# Patient Record
Sex: Female | Born: 1990 | Race: Black or African American | Hispanic: No | Marital: Single | Smoking: Former smoker
Health system: Southern US, Community
[De-identification: ages and names within clinical notes are randomized; demographics above are authoritative.]

## PROBLEM LIST (undated history)

## (undated) DIAGNOSIS — J45909 Unspecified asthma, uncomplicated: Secondary | ICD-10-CM

## (undated) DIAGNOSIS — I10 Essential (primary) hypertension: Secondary | ICD-10-CM

## (undated) DIAGNOSIS — K529 Noninfective gastroenteritis and colitis, unspecified: Secondary | ICD-10-CM

## (undated) HISTORY — DX: Unspecified asthma, uncomplicated: J45.909

## (undated) HISTORY — DX: Noninfective gastroenteritis and colitis, unspecified: K52.9

## (undated) HISTORY — DX: Essential (primary) hypertension: I10

---

## 2009-11-13 ENCOUNTER — Emergency Department (HOSPITAL_COMMUNITY): Admission: EM | Admit: 2009-11-13 | Discharge: 2009-11-13 | Payer: Self-pay | Admitting: Family Medicine

## 2009-11-18 ENCOUNTER — Emergency Department (HOSPITAL_COMMUNITY): Admission: EM | Admit: 2009-11-18 | Discharge: 2009-11-18 | Payer: Self-pay | Admitting: Emergency Medicine

## 2010-04-25 LAB — HEMOCCULT GUIAC POC 1CARD (OFFICE): Fecal Occult Bld: POSITIVE

## 2016-01-07 ENCOUNTER — Ambulatory Visit (INDEPENDENT_AMBULATORY_CARE_PROVIDER_SITE_OTHER): Payer: 59 | Admitting: Physician Assistant

## 2016-01-07 VITALS — BP 158/98 | HR 96 | Temp 98.7°F | Resp 17 | Ht 60.0 in | Wt 128.0 lb

## 2016-01-07 DIAGNOSIS — K529 Noninfective gastroenteritis and colitis, unspecified: Secondary | ICD-10-CM | POA: Diagnosis not present

## 2016-01-07 DIAGNOSIS — I10 Essential (primary) hypertension: Secondary | ICD-10-CM | POA: Diagnosis not present

## 2016-01-07 DIAGNOSIS — R109 Unspecified abdominal pain: Secondary | ICD-10-CM | POA: Diagnosis not present

## 2016-01-07 DIAGNOSIS — Z79899 Other long term (current) drug therapy: Secondary | ICD-10-CM

## 2016-01-07 MED ORDER — PROPRANOLOL HCL ER 60 MG PO CP24: 60.0000 mg | ORAL_CAPSULE | Freq: Every day | ORAL | 1 refills | Status: DC

## 2016-01-07 MED ORDER — MESALAMINE 1000 MG RE SUPP: 1000.0000 mg | Freq: Every day | RECTAL | 0 refills | Status: DC

## 2016-01-07 NOTE — Patient Instructions (Addendum)
Please schedule an appointment with GI for further work-up of your abdominal pain. See below for home care tips.  Return in one month for blood pressure recheck.   Thank you for coming in today. I hope you feel we met your needs.  Feel free to call [facility] if you have any questions or further requests.  Please consider signing up for MyChart if you do not already have it, as this is a great way to communicate with me.  Best,  Whitney McVey, PA-C    Ulcerative Colitis, Adult Ulcerative colitis is long-lasting (chronic) swelling (inflammation) of the large intestine (colon). Sores (ulcers) may also form on the colon. Ulcerative colitis is closely related to another condition of inflammation of the intestines that is called Crohn disease. Together, they are frequently referred to as inflammatory bowel disease (IBD). What are the causes? Ulcerative colitis is caused by increased activity of the immune system in the intestines. The immune system is the system that protects the body against harmful bacteria, viruses, fungi, and other things that can make you sick. When the immune system overacts, it causes inflammation. The cause of the increased immune system activity is not known. What increases the risk? Risk factors of ulcerative colitis include:  Age. This includes:  Being 50-7 years old.  Being older than 27 years old.  Having a family history of ulcerative colitis.  Being of Jewish descent. What are the signs or symptoms? Common symptoms of ulcerative colitis include rectal bleeding and diarrhea. There is a wide range of symptoms, and a person's symptoms depend on how severe the condition is. Additional symptoms may include:  Pain or cramping in the belly (abdomen).  Fever.  Fatigue.  Weight loss.  Night sweats.  Rectal pain.  Feeling the immediate need to have a bowel movement.  Nausea.  Loss of appetite.  Anemia.  Joint pain or soreness.  Eye  irritation.  Certain skin rashes. How is this diagnosed? Ulcerative colitis may be diagnosed by:  Medical history and physical exam.  Blood tests and stool tests.  X-rays.  CT scans.  Colonoscopy. For this test, a flexible tube is inserted into your anus and your colon is examined.  Examination of a tissue sample from your colon (biopsy). How is this treated? Treatment for ulcerative colitis may include medicines to:  Decrease inflammation.  Control your immune system. Surgery may also be necessary. Follow these instructions at home: Medicines and vitamins  Take medicines only as directed by your doctor. Do not take aspirin.  Ask your doctor if you should take any vitamins or supplements. Lifestyle  Exercise regularly.  Limit alcohol intake to no more than 1 drink per day for nonpregnant women and 2 drinks per day for men. One drink equals 12 ounces of beer, 5 ounces of wine, or 1 ounces of hard liquor. Eating and drinking  Drink enough fluid to keep your urine clear or pale yellow.  Ask your health care provider about the best diet for you. Follow the diet as directed by your health care provider. This may include:  Avoiding carbonated drinks.  Avoiding popcorn, vegetable skins, nuts, and other high-fiber foods when you have symptoms of ulcerative colitis.  Eating smaller meals more often.  Keeping a food diary. This may help you to find and avoid any foods that make you feel not well.  Limit your caffeine intake. General instructions  Keep all follow-up appointments as directed by your health care provider. This is important. Contact a  health care provider if:  Your symptoms do not improve or get worse with treatment.  You continue to lose weight.  You have constant cramps or loose bowels.  You develop a new skin rash, skin sores, or eye problems.  You have a fever or chills. Get help right away if:  You have bloody diarrhea.  You have severe  pain in your abdomen.  You vomit. This information is not intended to replace advice given to you by your health care provider. Make sure you discuss any questions you have with your health care provider. Document Released: 11/06/2004 Document Revised: 09/30/2015 Document Reviewed: 05/22/2014 Elsevier Interactive Patient Education  2017 [employer].  IF you received an x-ray today, you will receive an invoice from [facility]. Please contact [facility] at [phone] with questions or concerns regarding your invoice.   IF you received labwork today, you will receive an invoice from [employer]. Please contact [facility] at [phone] with questions or concerns regarding your invoice.   Our billing staff will not be able to assist you with questions regarding bills from these companies.  You will be contacted with the lab results as soon as they are available. The fastest way to get your results is to activate your My Chart account. Instructions are located on the last page of this paperwork. If you have not heard from [place] regarding the results in 2 weeks, please contact this office.

## 2016-01-07 NOTE — Progress Notes (Signed)
Mickle MalloryChaquell Cocuzza  MRN: [id number] DOB: 09/16/1990  PCP: No primary care provider on file.  Subjective:  Pt is a pleasant 25 year old female who presents to clinic for multiple complaints. She is from [place], [place], has been living in [place] for three years.  She works at [employer] and [employer] [place].   Colitis - She was diagnosed with colitis while living in [place]. She cannot recall what type of colitis. She was supposed to get colonoscopy but did not have insurance at the time. Last treatment was 3-4 years ago - she cannot recall what medications she was prescribed.  Left-sided abdominal pain flared up two weeks ago. Comes and goes in waves, feels like pain of colitis flare. The pain woke her up this morning. Describes her painas aching and throbbing, 5/10 pain. Notes occasional blood in stool, twice a month. Occasional constipation. Denies diarrhea, nausea, fevers, chills, vomiting.   HTN - Blood pressure today is 158/98. She was treated for HTN in high school with Propranolol. Reports controlled blood pressure and no side effects. She has not been on medication for 6 months to a year due to cost of prescription and not having insurance. She now has different insurance and would like a refill of Propranolol. Denies headache, light-headedness, vision changes, chest pain, palpitations, SOB.    Review of Systems  Constitutional: Negative for chills, diaphoresis, fatigue and fever.  Respiratory: Negative for cough, chest tightness, shortness of breath and wheezing.   Cardiovascular: Negative for chest pain and palpitations.  Gastrointestinal: Positive for abdominal pain and blood in stool. Negative for abdominal distention, diarrhea, nausea and vomiting.  Neurological: Negative for weakness, light-headedness and headaches.    There are no active problems to display for this patient.   No current outpatient prescriptions on file prior to visit.   No  current facility-administered medications on file prior to visit.     No Known Allergies   Objective:  BP (!) 158/98 (BP Location: Right Arm, Patient Position: Sitting, Cuff Size: Normal)   Pulse 96   Temp 98.7 F (37.1 C) (Oral)   Resp 17   Ht 5' (1.524 m)   Wt 128 lb (58.1 kg)   LMP 12/07/2015   SpO2 98%   BMI 25.00 kg/m   Physical Exam  Constitutional: She is oriented to person, place, and time and well-developed, well-nourished, and in no distress. No distress.  Cardiovascular: Normal rate, regular rhythm and normal heart sounds.   Abdominal: Soft. Normal appearance and bowel sounds are normal. She exhibits no distension. There is tenderness in the left lower quadrant. There is no rigidity and no guarding.  Neurological: She is alert and oriented to person, place, and time. GCS score is 15.  Skin: Skin is warm and dry.  Psychiatric: Mood, memory, affect and judgment normal.  Vitals reviewed.   Assessment and Plan :  1. Essential hypertension 2. Encounter for medication management - propranolol ER (INDERAL LA) 60 MG 24 hr capsule; Take 1 capsule (60 mg total) by mouth daily.  Dispense: 30 capsule; Refill: 1 - RTC in one month for blood pressure recheck.   3. Abdominal pain, unspecified abdominal location 4. Colitis - mesalamine (CANASA) 1000 MG suppository; Place 1 suppository (1,000 mg total) rectally at bedtime.  Dispense: 30 suppository; Refill: 0 - Ambulatory referral to Gastroenterology - Will treat. Referral to GI for further work-up and imaging as she is unsure about the details of her colitis. Will request medical records  from her PCP in [place].    Marco CollieWhitney Biance Moncrief, PA-C  [facility] 01/07/2016 3:07 PM

## 2016-01-09 ENCOUNTER — Encounter: Payer: Self-pay | Admitting: Physician Assistant

## 2016-01-16 ENCOUNTER — Ambulatory Visit (INDEPENDENT_AMBULATORY_CARE_PROVIDER_SITE_OTHER): Payer: 59 | Admitting: Physician Assistant

## 2016-01-16 ENCOUNTER — Other Ambulatory Visit (INDEPENDENT_AMBULATORY_CARE_PROVIDER_SITE_OTHER): Payer: 59

## 2016-01-16 ENCOUNTER — Encounter: Payer: Self-pay | Admitting: Physician Assistant

## 2016-01-16 VITALS — BP 140/88 | HR 60 | Ht 60.0 in | Wt 128.4 lb

## 2016-01-16 DIAGNOSIS — K625 Hemorrhage of anus and rectum: Secondary | ICD-10-CM

## 2016-01-16 DIAGNOSIS — R109 Unspecified abdominal pain: Secondary | ICD-10-CM

## 2016-01-16 LAB — CBC WITH DIFFERENTIAL/PLATELET
BASOS ABS: 0 10*3/uL (ref 0.0–0.1)
Basophils Relative: 0.7 % (ref 0.0–3.0)
EOS ABS: 0.1 10*3/uL (ref 0.0–0.7)
Eosinophils Relative: 1.4 % (ref 0.0–5.0)
HEMATOCRIT: 42 % (ref 36.0–46.0)
Hemoglobin: 14.1 g/dL (ref 12.0–15.0)
LYMPHS PCT: 40.2 % (ref 12.0–46.0)
Lymphs Abs: 1.8 10*3/uL (ref 0.7–4.0)
MCHC: 33.5 g/dL (ref 30.0–36.0)
MCV: 85.3 fl (ref 78.0–100.0)
Monocytes Absolute: 0.4 10*3/uL (ref 0.1–1.0)
Monocytes Relative: 9.3 % (ref 3.0–12.0)
NEUTROS ABS: 2.1 10*3/uL (ref 1.4–7.7)
Neutrophils Relative %: 48.4 % (ref 43.0–77.0)
PLATELETS: 252 10*3/uL (ref 150.0–400.0)
RBC: 4.92 Mil/uL (ref 3.87–5.11)
RDW: 13.4 % (ref 11.5–15.5)
WBC: 4.4 10*3/uL (ref 4.0–10.5)

## 2016-01-16 LAB — COMPREHENSIVE METABOLIC PANEL
ALT: 8 U/L (ref 0–35)
AST: 13 U/L (ref 0–37)
Albumin: 4.1 g/dL (ref 3.5–5.2)
Alkaline Phosphatase: 63 U/L (ref 39–117)
BILIRUBIN TOTAL: 0.4 mg/dL (ref 0.2–1.2)
BUN: 10 mg/dL (ref 6–23)
CALCIUM: 9.2 mg/dL (ref 8.4–10.5)
CO2: 30 meq/L (ref 19–32)
CREATININE: 0.85 mg/dL (ref 0.40–1.20)
Chloride: 103 mEq/L (ref 96–112)
GFR: 102.66 mL/min (ref >60.00)
GLUCOSE: 93 mg/dL (ref 70–99)
Potassium: 4.6 mEq/L (ref 3.5–5.1)
SODIUM: 137 meq/L (ref 135–145)
Total Protein: 7.4 g/dL (ref 6.0–8.3)

## 2016-01-16 LAB — SEDIMENTATION RATE: Sed Rate: 32 mm/hr — ABNORMAL HIGH (ref 0–20)

## 2016-01-16 LAB — HIGH SENSITIVITY CRP: CRP HIGH SENSITIVITY: 3.35 mg/L (ref 0.000–5.000)

## 2016-01-16 MED ORDER — NA SULFATE-K SULFATE-MG SULF 17.5-3.13-1.6 GM/177ML PO SOLN: 1.0000 | Freq: Once | ORAL | 0 refills | Status: AC

## 2016-01-16 NOTE — Progress Notes (Addendum)
Subjective:    Patient ID: Cheryl Hansen, female    DOB: 07-31-1990, 25 y.o.   MRN: [id number]  HPI Cheryl Hansen is a pleasant 25 year old African-American female, new to GI today referred by [facility]/Elizabeth McVeigh PA-C for evaluation of left-sided abdominal pain and intermittent rectal bleeding. Patient states that she did have some GI evaluation done about 5 years ago in [place] was told that she probably had ulcerative colitis. She states that she did not have health insurance at that time and so could not undergo colonoscopy. We did obtain copies of those records. She was seen in 2013 by [facility] after an ER visit with complaints of abdominal pain. She apparently had CT of the abdomen and pelvis done showing a probable mild colitis. She was treated with short course of Cipro and Flagyl, and then colonoscopy was recommended which she did not follow through with. She says over the past few years had some mild left-sided abdominal pain off and on but nothing significant and also has noted small amounts of bright red blood mixed in with her bowel movements vary sporadically over the past couple of years. She says she may see blood once or twice a month. 2 weeks ago she had an episode of more significant left abdominal pain and cramping that lasted for several days and has since resolved. She has no complaints of diarrhea and says her bowel movements are fairly regular. She had and given a Canasa suppository by urgent care. She did not have any labs or imaging done. Family history is pertinent for grandmother diagnosed with colon cancer in her 46s is no family history of IBD. Other medical problems include hypertension and asthma.  Review of Systems Pertinent positive and negative review of systems were noted in the above HPI section.  All other review of systems was otherwise negative.  Outpatient Encounter Prescriptions as of 01/16/2016    Medication Sig  . mesalamine (CANASA) 1000 MG suppository Place 1 suppository (1,000 mg total) rectally at bedtime.  . propranolol ER (INDERAL LA) 60 MG 24 hr capsule Take 1 capsule (60 mg total) by mouth daily.  . Na Sulfate-K Sulfate-Mg Sulf 17.5-3.13-1.6 GM/180ML SOLN Take 1 kit by mouth once.   No facility-administered encounter medications on file as of 01/16/2016.    No Known Allergies There are no active problems to display for this patient.  Social History   Social History  . Marital status: Single    Spouse name: N/A  . Number of children: N/A  . Years of education: N/A   Occupational History  . Not on file.   Social History Main Topics  . Smoking status: Former Research scientist (life sciences)  . Smokeless tobacco: Never Used  . Alcohol use No  . Drug use: No  . Sexual activity: Not on file   Other Topics Concern  . Not on file   Social History Narrative  . No narrative on file    Ms. Tirrell's family history includes Cancer in her paternal grandmother; Hyperlipidemia in her mother; Hypertension in her maternal grandmother and paternal grandfather.      Objective:    Vitals:   01/16/16 0837  BP: 140/88  Pulse: 60    Physical Exam  well-developed young female in no acute distress, pleasant blood pressure 140/88 pulse 60, Height 5 foot, weight 128, BMI 25.0. HEENT; nontraumatic normocephalic EOMI PERRLA sclera anicteric, Cardiovascular; regular rate and rhythm with S1-S2 no murmur or gallop, Pulmonary ;clear  bilaterally, Abdomen; soft , minimally tender left mid left lower quadrant there is no guarding or rebound no palpable mass or hepatosplenomegaly, Rectal; exam not done, Extremities; no clubbing cyanosis or edema skin warm and dry, Neuropsych ;mood and affect appropriate       Assessment & Plan:   #74 25 year old African-American female with a four-year history of intermittent left-sided abdominal pain and sporadic episodes of bright red blood mixed with bowel movements.  Patient had an episode of more intense left-sided abdominal pain a few weeks ago which has since subsided. Rule out IBD, rule out IBS with intermittent hemorrhoidal bleeding #2 hypertension #3 asthma  #4 positive family history of colon cancer in patient's grandmother  Plan; check CBC with differential, CMET, sedimentation rate, and CRP Schedule for colonoscopy with Dr. Ardis Hughs. Procedure discussed in detail with the patient including risks and benefits and she is agreeable to proceed. Further plans and recommendations pending results of above.   Addendum records received from colonoscopy done in June 2015 Dr. Anne Hahn [place]  [facility] was a normal exam with the exception of first grade internal hemorrhoids  Kyair Ditommaso S Timiko Offutt PA-C 01/16/2016   Cc: Desiree Lucy*

## 2016-01-16 NOTE — Patient Instructions (Signed)
Please go to the basement level to have your labs drawn.   You have been scheduled for a colonoscopy. Please follow written instructions given to you at your visit today.  Please pick up your prep supplies at the pharmacy within the next 1-3 days. If you use inhalers (even only as needed), please bring them with you on the day of your procedure. Your physician has requested that you go to www.startemmi.com and enter the access code given to you at your visit today. This web site gives a general overview about your procedure. However, you should still follow specific instructions given to you by our office regarding your preparation for the procedure. 

## 2016-01-17 NOTE — Progress Notes (Signed)
I agree with the above note, plan 

## 2016-01-21 ENCOUNTER — Encounter: Payer: 59 | Admitting: Gastroenterology

## 2016-03-03 ENCOUNTER — Other Ambulatory Visit: Payer: Self-pay | Admitting: *Deleted

## 2016-03-03 ENCOUNTER — Telehealth: Payer: Self-pay | Admitting: Physician Assistant

## 2016-03-03 NOTE — Telephone Encounter (Signed)
LM for the patient to advise her new instructions for the rescheduled colonoscopy date of 03-10-2016 are at the front desk for her.

## 2016-03-10 ENCOUNTER — Ambulatory Visit (AMBULATORY_SURGERY_CENTER): Payer: 59 | Admitting: Gastroenterology

## 2016-03-10 ENCOUNTER — Encounter: Payer: Self-pay | Admitting: Gastroenterology

## 2016-03-10 VITALS — BP 127/81 | HR 64 | Temp 97.3°F | Resp 15 | Ht 64.0 in | Wt 128.0 lb

## 2016-03-10 DIAGNOSIS — R1032 Left lower quadrant pain: Secondary | ICD-10-CM | POA: Diagnosis not present

## 2016-03-10 DIAGNOSIS — K625 Hemorrhage of anus and rectum: Secondary | ICD-10-CM

## 2016-03-10 DIAGNOSIS — K649 Unspecified hemorrhoids: Secondary | ICD-10-CM

## 2016-03-10 MED ORDER — SODIUM CHLORIDE 0.9 % IV SOLN: 500.0000 mL | INTRAVENOUS | Status: DC

## 2016-03-10 NOTE — Progress Notes (Signed)
Report given to PACU RN, vss 

## 2016-03-10 NOTE — Patient Instructions (Signed)
Discharge instructions given. Handout on hemorrhoids. Resume previous medications. YOU HAD AN ENDOSCOPIC PROCEDURE TODAY AT THE [facility]:   Refer to the procedure report that was given to you for any specific questions about what was found during the examination.  If the procedure report does not answer your questions, please call your gastroenterologist to clarify.  If you requested that your care partner not be given the details of your procedure findings, then the procedure report has been included in a sealed envelope for you to review at your convenience later.  YOU SHOULD EXPECT: Some feelings of bloating in the abdomen. Passage of more gas than usual.  Walking can help get rid of the air that was put into your GI tract during the procedure and reduce the bloating. If you had a lower endoscopy (such as a colonoscopy or flexible sigmoidoscopy) you may notice spotting of blood in your stool or on the toilet paper. If you underwent a bowel prep for your procedure, you may not have a normal bowel movement for a few days.  Please Note:  You might notice some irritation and congestion in your nose or some drainage.  This is from the oxygen used during your procedure.  There is no need for concern and it should clear up in a day or so.  SYMPTOMS TO REPORT IMMEDIATELY:   Following lower endoscopy (colonoscopy or flexible sigmoidoscopy):  Excessive amounts of blood in the stool  Significant tenderness or worsening of abdominal pains  Swelling of the abdomen that is new, acute  Fever of 100F or higher   For urgent or emergent issues, a gastroenterologist can be reached at any hour by calling [phone].   DIET:  We do recommend a small meal at first, but then you may proceed to your regular diet.  Drink plenty of fluids but you should avoid alcoholic beverages for 24 hours.  ACTIVITY:  You should plan to take it easy for the rest of today and you should NOT DRIVE or use heavy  machinery until tomorrow (because of the sedation medicines used during the test).    FOLLOW UP: Our staff will call the number listed on your records the next business day following your procedure to check on you and address any questions or concerns that you may have regarding the information given to you following your procedure. If we do not reach you, we will leave a message.  However, if you are feeling well and you are not experiencing any problems, there is no need to return our call.  We will assume that you have returned to your regular daily activities without incident.  If any biopsies were taken you will be contacted by phone or by letter within the next 1-3 weeks.  Please call us at [phone] if you have not heard about the biopsies in 3 weeks.    SIGNATURES/CONFIDENTIALITY: You and/or your care partner have signed paperwork which will be entered into your electronic medical record.  These signatures attest to the fact that that the information above on your After Visit Summary has been reviewed and is understood.  Full responsibility of the confidentiality of this discharge information lies with you and/or your care-partner. 

## 2016-03-10 NOTE — Op Note (Signed)
[facility] Patient Name: Cheryl Hansen Procedure Date: 03/10/2016 3:18 PM MRN: [id number] Endoscopist: Rachael Feeaniel P Darianne Muralles , MD Age: 26 Referring MD:  Date of Birth: 02/09/1991 Gender: Female Account #: [id number] Procedure:                Colonoscopy Indications:              Abdominal pain in the left lower quadrant,                            Hematochezia Medicines:                Monitored Anesthesia Care Procedure:                Pre-Anesthesia Assessment:                           - Prior to the procedure, a History and Physical                            was performed, and patient medications and                            allergies were reviewed. The patient's tolerance of                            previous anesthesia was also reviewed. The risks                            and benefits of the procedure and the sedation                            options and risks were discussed with the patient.                            All questions were answered, and informed consent                            was obtained. Prior Anticoagulants: The patient has                            taken no previous anticoagulant or antiplatelet                            agents. ASA Grade Assessment: II - A patient with                            mild systemic disease. After reviewing the risks                            and benefits, the patient was deemed in                            satisfactory condition to undergo the procedure.  After obtaining informed consent, the colonoscope                            was passed under direct vision. Throughout the                            procedure, the patient's blood pressure, pulse, and                            oxygen saturations were monitored continuously. The                            Model CF-HQ190L [id number]) scope was introduced                            through the anus and advanced to the the terminal                             ileum. The colonoscopy was performed without                            difficulty. The patient tolerated the procedure                            well. The quality of the bowel preparation was                            excellent. The terminal ileum, ileocecal valve,                            appendiceal orifice, and rectum were photographed. Scope In: 3:20:49 PM Scope Out: 3:29:38 PM Scope Withdrawal Time: 0 hours 7 minutes 20 seconds  Total Procedure Duration: 0 hours 8 minutes 49 seconds  Findings:                 The terminal ileum appeared normal.                           External hemorrhoids were found. The hemorrhoids                            were small.                           The exam was otherwise without abnormality on                            direct and retroflexion views. Complications:            No immediate complications. Estimated blood loss:                            None                           . Estimated Blood Loss:     Estimated  blood loss: none. Impression:               - The examined portion of the ileum was normal.                           - External hemorrhoids.                           - The examination was otherwise normal on direct                            and retroflexion views.                           - No polyps, cancer, inflammation or infection. Recommendation:           - Patient has a contact number available for                            emergencies. The signs and symptoms of potential                            delayed complications were discussed with the                            patient. Return to normal activities tomorrow.                            Written discharge instructions were provided to the                            patient.                           - Resume previous diet.                           - Continue present medications.                           - Repeat colonoscopy at age  89 for screening                            purposes. Rachael Fee, MD 03/10/2016 3:32:41 PM This report has been signed electronically.

## 2016-03-11 ENCOUNTER — Telehealth: Payer: Self-pay

## 2016-03-11 NOTE — Telephone Encounter (Signed)
  Follow up Call-  Call back number 03/10/2016  Post procedure Call Back phone  # [phone]  Permission to leave phone message Yes  Some recent data might be hidden     Patient questions:  Do you have a fever, pain , or abdominal swelling? No. Pain Score  0 *  Have you tolerated food without any problems? Yes.    Have you been able to return to your normal activities? Yes.    Do you have any questions about your discharge instructions: Diet   No. Medications  No. Follow up visit  No.  Do you have questions or concerns about your Care? No.  Actions: * If pain score is 4 or above: No action needed, pain <4.

## 2016-07-02 ENCOUNTER — Encounter: Payer: Self-pay | Admitting: Physician Assistant

## 2016-07-02 ENCOUNTER — Ambulatory Visit (INDEPENDENT_AMBULATORY_CARE_PROVIDER_SITE_OTHER): Payer: 59 | Admitting: Physician Assistant

## 2016-07-02 VITALS — BP 163/100 | HR 86 | Temp 98.6°F | Resp 16 | Ht 64.0 in | Wt 134.6 lb

## 2016-07-02 DIAGNOSIS — I10 Essential (primary) hypertension: Secondary | ICD-10-CM | POA: Diagnosis not present

## 2016-07-02 DIAGNOSIS — R030 Elevated blood-pressure reading, without diagnosis of hypertension: Secondary | ICD-10-CM

## 2016-07-02 DIAGNOSIS — Z79899 Other long term (current) drug therapy: Secondary | ICD-10-CM | POA: Diagnosis not present

## 2016-07-02 MED ORDER — PROPRANOLOL HCL ER 60 MG PO CP24: 60.0000 mg | ORAL_CAPSULE | Freq: Every day | ORAL | 6 refills | Status: DC

## 2016-07-02 NOTE — Patient Instructions (Addendum)
Please schedule an annual exam for your next appointment. We will do your PAP. We can refill your medications at that time as well.   Thank you for coming in today. I hope you feel we met your needs.  Feel free to call [facility] if you have any questions or further requests.  Please consider signing up for MyChart if you do not already have it, as this is a great way to communicate with me.  Best,  Whitney McVey, PA-C  IF you received an x-ray today, you will receive an invoice from [facility]. Please contact [facility] at [phone] with questions or concerns regarding your invoice.   IF you received labwork today, you will receive an invoice from [facility]. Please contact [facility] at [phone] with questions or concerns regarding your invoice.   Our billing staff will not be able to assist you with questions regarding bills from these companies.  You will be contacted with the lab results as soon as they are available. The fastest way to get your results is to activate your My Chart account. Instructions are located on the last page of this paperwork. If you have not heard from [place] regarding the results in 2 weeks, please contact this office.    \

## 2016-07-02 NOTE — Progress Notes (Signed)
   Cheryl MalloryChaquell Hansen  MRN: [id number] DOB: 12/19/1990  PCP: Sebastian AcheMcVey, Declan Adamson Whitney, PA-C  Subjective:  Pt is a 26 year old female who presents to clinic for blood pressure check and medication refill. She is from [place], [place], [place] been living in [place].  She works at [employer] and [employer] [place]. She was treated for HTN in high school with Propranolol. She takes Propranolol 60mg  qd. She is out and has not had her medication today.  Today's blood pressure is 163/100.  She thinks she needs a higher dose.  Denies chest pain, headache, shob, palpitations, vision changes.   There are care gaps for this patient including PAP and Tdap.   Review of Systems  Respiratory: Negative for chest tightness and shortness of breath.   Cardiovascular: Negative for chest pain and palpitations.  Neurological: Negative for syncope, light-headedness and headaches.    There are no active problems to display for this patient.   Current Outpatient Prescriptions on File Prior to Visit  Medication Sig Dispense Refill  . propranolol ER (INDERAL LA) 60 MG 24 hr capsule Take 1 capsule (60 mg total) by mouth daily. 30 capsule 1   Current Facility-Administered Medications on File Prior to Visit  Medication Dose Route Frequency Provider Last Rate Last Dose  . 0.9 %  sodium chloride infusion  500 mL Intravenous Continuous Rachael FeeJacobs, Daniel P, MD        No Known Allergies   Objective:  BP (!) 163/100   Pulse 86   Temp 98.6 F (37 C) (Oral)   Resp 16   Ht 5\' 4"  (1.626 m)   Wt 134 lb 9.6 oz (61.1 kg)   LMP 05/30/2016   SpO2 100%   BMI 23.10 kg/m   Physical Exam  Constitutional: She is oriented to person, place, and time and well-developed, well-nourished, and in no distress. No distress.  Cardiovascular: Normal rate, regular rhythm and normal heart sounds.   Neurological: She is alert and oriented to person, place, and time. GCS score is 15.  Skin: Skin is  warm and dry.  Psychiatric: Mood, memory, affect and judgment normal.  Vitals reviewed.   Assessment and Plan :  1. Encounter for medication management 2. Essential hypertension 3. Elevated blood pressure reading  - propranolol ER (INDERAL LA) 60 MG 24 hr capsule; Take 1 capsule (60 mg total) by mouth daily.  Dispense: 60 capsule; Refill: 6 - Pt is out of her medication x 1 day. Asymptomatic.  Advised pt to RTC sooner for refills. She is due for her annual exam, overdue for her PAP and Tdap. She understands and agrees.    Marco CollieWhitney Shailene Demonbreun, PA-C  [facility] 07/02/2016 8:09 AM

## 2016-11-26 ENCOUNTER — Encounter: Payer: Self-pay | Admitting: Physician Assistant

## 2016-11-26 ENCOUNTER — Ambulatory Visit (INDEPENDENT_AMBULATORY_CARE_PROVIDER_SITE_OTHER): Payer: 59 | Admitting: Physician Assistant

## 2016-11-26 VITALS — BP 124/90 | HR 58 | Temp 98.0°F | Resp 16 | Ht 60.5 in | Wt 144.2 lb

## 2016-11-26 DIAGNOSIS — R635 Abnormal weight gain: Secondary | ICD-10-CM

## 2016-11-26 DIAGNOSIS — Z202 Contact with and (suspected) exposure to infections with a predominantly sexual mode of transmission: Secondary | ICD-10-CM

## 2016-11-26 DIAGNOSIS — Z1329 Encounter for screening for other suspected endocrine disorder: Secondary | ICD-10-CM

## 2016-11-26 DIAGNOSIS — Z130 Encounter for screening for diseases of the blood and blood-forming organs and certain disorders involving the immune mechanism: Secondary | ICD-10-CM

## 2016-11-26 DIAGNOSIS — Z01419 Encounter for gynecological examination (general) (routine) without abnormal findings: Secondary | ICD-10-CM | POA: Diagnosis not present

## 2016-11-26 DIAGNOSIS — Z23 Encounter for immunization: Secondary | ICD-10-CM

## 2016-11-26 DIAGNOSIS — Z124 Encounter for screening for malignant neoplasm of cervix: Secondary | ICD-10-CM

## 2016-11-26 DIAGNOSIS — N926 Irregular menstruation, unspecified: Secondary | ICD-10-CM | POA: Diagnosis not present

## 2016-11-26 DIAGNOSIS — Z13220 Encounter for screening for lipoid disorders: Secondary | ICD-10-CM | POA: Diagnosis not present

## 2016-11-26 DIAGNOSIS — Z0000 Encounter for general adult medical examination without abnormal findings: Secondary | ICD-10-CM | POA: Diagnosis not present

## 2016-11-26 LAB — POCT URINALYSIS DIP (MANUAL ENTRY)
Bilirubin, UA: NEGATIVE
Blood, UA: NEGATIVE
Glucose, UA: NEGATIVE mg/dL
Ketones, POC UA: NEGATIVE mg/dL
Leukocytes, UA: NEGATIVE
Nitrite, UA: NEGATIVE
Protein Ur, POC: NEGATIVE mg/dL
Spec Grav, UA: <=1.005 — AB (ref 1.010–1.025)
Urobilinogen, UA: 0.2 U/dL
pH, UA: 6 (ref 5.0–8.0)

## 2016-11-26 LAB — POCT URINE PREGNANCY: Preg Test, Ur: NEGATIVE

## 2016-11-26 NOTE — Patient Instructions (Addendum)
- Improving your diet will help bring down your blood pressure (see below on DASH diet).  - Try to track down your old medical records to see if you have ever had imaging done testing for "renal artery stenosis". Sometimes this condition can cause high blood pressure in young people.  - Ask your siblings if they have hypertension - and how long have they had it. - You will receive a phone call to schedule an appt with GYN - Call your insurance and ask which dentists are covered... And make an appointment! Start brushing twice daily.  - We will contact you about today's lab work when the results come back.   Thank you for coming in today. I hope you feel we met your needs.  Feel free to call PCP if you have any questions or further requests.  Please consider signing up for MyChart if you do not already have it, as this is a great way to communicate with me.  Best,  Whitney McVey, PA-C  DASH Eating Plan DASH stands for "Dietary Approaches to Stop Hypertension." The DASH eating plan is a healthy eating plan that has been shown to reduce high blood pressure (hypertension). It may also reduce your risk for type 2 diabetes, heart disease, and stroke. The DASH eating plan may also help with weight loss. What are tips for following this plan? General guidelines  Avoid eating more than 2,300 mg (milligrams) of salt (sodium) a day. If you have hypertension, you may need to reduce your sodium intake to 1,500 mg a day.  Limit alcohol intake to no more than 1 drink a day for nonpregnant women and 2 drinks a day for men. One drink equals 12 oz of beer, 5 oz of wine, or 1 oz of hard liquor.  Work with your health care provider to maintain a healthy body weight or to lose weight. Ask what an ideal weight is for you.  Get at least 30 minutes of exercise that causes your heart to beat faster (aerobic exercise) most days of the week. Activities may include walking, swimming, or biking.  Work with your  health care provider or diet and nutrition specialist (dietitian) to adjust your eating plan to your individual calorie needs. Reading food labels  Check food labels for the amount of sodium per serving. Choose foods with less than 5 percent of the Daily Value of sodium. Generally, foods with less than 300 mg of sodium per serving fit into this eating plan.  To find whole grains, look for the word "whole" as the first word in the ingredient list. Shopping  Buy products labeled as "low-sodium" or "no salt added."  Buy fresh foods. Avoid canned foods and premade or frozen meals. Cooking  Avoid adding salt when cooking. Use salt-free seasonings or herbs instead of table salt or sea salt. Check with your health care provider or pharmacist before using salt substitutes.  Do not fry foods. Cook foods using healthy methods such as baking, boiling, grilling, and broiling instead.  Cook with heart-healthy oils, such as olive, canola, soybean, or sunflower oil. Meal planning   Eat a balanced diet that includes: ? 5 or more servings of fruits and vegetables each day. At each meal, try to fill half of your plate with fruits and vegetables. ? Up to 6-8 servings of whole grains each day. ? Less than 6 oz of lean meat, poultry, or fish each day. A 3-oz serving of meat is about the same size as  a deck of cards. One egg equals 1 oz. ? 2 servings of low-fat dairy each day. ? A serving of nuts, seeds, or beans 5 times each week. ? Heart-healthy fats. Healthy fats called Omega-3 fatty acids are found in foods such as flaxseeds and coldwater fish, like sardines, salmon, and mackerel.  Limit how much you eat of the following: ? Canned or prepackaged foods. ? Food that is high in trans fat, such as fried foods. ? Food that is high in saturated fat, such as fatty meat. ? Sweets, desserts, sugary drinks, and other foods with added sugar. ? Full-fat dairy products.  Do not salt foods before eating.  Try  to eat at least 2 vegetarian meals each week.  Eat more home-cooked food and less restaurant, buffet, and fast food.  When eating at a restaurant, ask that your food be prepared with less salt or no salt, if possible. What foods are recommended? The items listed may not be a complete list. Talk with your dietitian about what dietary choices are best for you. Grains Whole-grain or whole-wheat bread. Whole-grain or whole-wheat pasta. Brown rice. [place]. Bulgur. Whole-grain and low-sodium cereals. Pita bread. Low-fat, low-sodium crackers. Whole-wheat flour tortillas. Vegetables Fresh or frozen vegetables (raw, steamed, roasted, or grilled). Low-sodium or reduced-sodium tomato and vegetable juice. Low-sodium or reduced-sodium tomato sauce and tomato paste. Low-sodium or reduced-sodium canned vegetables. Fruits All fresh, dried, or frozen fruit. Canned fruit in natural juice (without added sugar). Meat and other protein foods Skinless chicken or [place]. Ground chicken or [place]. Pork with fat trimmed off. Fish and seafood. Egg whites. Dried beans, peas, or lentils. Unsalted nuts, nut butters, and seeds. Unsalted canned beans. Lean cuts of beef with fat trimmed off. Low-sodium, lean deli meat. Dairy Low-fat (1%) or fat-free (skim) milk. Fat-free, low-fat, or reduced-fat cheeses. Nonfat, low-sodium ricotta or cottage cheese. Low-fat or nonfat yogurt. Low-fat, low-sodium cheese. Fats and oils Soft margarine without trans fats. Vegetable oil. Low-fat, reduced-fat, or light mayonnaise and salad dressings (reduced-sodium). Canola, safflower, olive, soybean, and sunflower oils. Avocado. Seasoning and other foods Herbs. Spices. Seasoning mixes without salt. Unsalted popcorn and pretzels. Fat-free sweets. What foods are not recommended? The items listed may not be a complete list. Talk with your dietitian about what dietary choices are best for you. Grains Baked goods made with fat, such as  croissants, muffins, or some breads. Dry pasta or rice meal packs. Vegetables Creamed or fried vegetables. Vegetables in a cheese sauce. Regular canned vegetables (not low-sodium or reduced-sodium). Regular canned tomato sauce and paste (not low-sodium or reduced-sodium). Regular tomato and vegetable juice (not low-sodium or reduced-sodium). Angie Fava. Olives. Fruits Canned fruit in a light or heavy syrup. Fried fruit. Fruit in cream or butter sauce. Meat and other protein foods Fatty cuts of meat. Ribs. Fried meat. Berniece Salines. Sausage. Bologna and other processed lunch meats. Salami. Fatback. Hotdogs. Bratwurst. Salted nuts and seeds. Canned beans with added salt. Canned or smoked fish. Whole eggs or egg yolks. Chicken or [place] with skin. Dairy Whole or 2% milk, cream, and half-and-half. Whole or full-fat cream cheese. Whole-fat or sweetened yogurt. Full-fat cheese. Nondairy creamers. Whipped toppings. Processed cheese and cheese spreads. Fats and oils Butter. Stick margarine. Lard. Shortening. Ghee. Bacon fat. Tropical oils, such as coconut, palm kernel, or palm oil. Seasoning and other foods Salted popcorn and pretzels. Onion salt, garlic salt, seasoned salt, table salt, and sea salt. Worcestershire sauce. Tartar sauce. Barbecue sauce. Teriyaki sauce. Soy sauce, including reduced-sodium. Steak sauce.  Canned and packaged gravies. Fish sauce. Oyster sauce. Cocktail sauce. Horseradish that you find on the shelf. Ketchup. Mustard. Meat flavorings and tenderizers. Bouillon cubes. Hot sauce and Tabasco sauce. Premade or packaged marinades. Premade or packaged taco seasonings. Relishes. Regular salad dressings. Where to find more information:  National Heart, Lung, and [place]: https://wilson-eaton.com/  American Heart Association: www.heart.org Summary  The DASH eating plan is a healthy eating plan that has been shown to reduce high blood pressure (hypertension). It may also reduce your risk for type 2  diabetes, heart disease, and stroke.  With the DASH eating plan, you should limit salt (sodium) intake to 2,300 mg a day. If you have hypertension, you may need to reduce your sodium intake to 1,500 mg a day.  When on the DASH eating plan, aim to eat more fresh fruits and vegetables, whole grains, lean proteins, low-fat dairy, and heart-healthy fats.  Work with your health care provider or diet and nutrition specialist (dietitian) to adjust your eating plan to your individual calorie needs. This information is not intended to replace advice given to you by your health care provider. Make sure you discuss any questions you have with your health care provider. Document Released: 01/16/2011 Document Revised: 01/21/2016 Document Reviewed: 01/21/2016 Elsevier Interactive Patient Education  2017 [place] Maintenance, Female Adopting a healthy lifestyle and getting preventive care can go a long way to promote health and wellness. Talk with your health care provider about what schedule of regular examinations is right for you. This is a good chance for you to check in with your provider about disease prevention and staying healthy. In between checkups, there are plenty of things you can do on your own. Experts have done a lot of research about which lifestyle changes and preventive measures are most likely to keep you healthy. Ask your health care provider for more information. Weight and diet Eat a healthy diet  Be sure to include plenty of vegetables, fruits, low-fat dairy products, and lean protein.  Do not eat a lot of foods high in solid fats, added sugars, or salt.  Get regular exercise. This is one of the most important things you can do for your health. ? Most adults should exercise for at least 150 minutes each week. The exercise should increase your heart rate and make you sweat (moderate-intensity exercise). ? Most adults should also do strengthening exercises at least twice a  week. This is in addition to the moderate-intensity exercise.  Maintain a healthy weight  Body mass index (BMI) is a measurement that can be used to identify possible weight problems. It estimates body fat based on height and weight. Your health care provider can help determine your BMI and help you achieve or maintain a healthy weight.  For females 60 years of age and older: ? A BMI below 18.5 is considered underweight. ? A BMI of 18.5 to 24.9 is normal. ? A BMI of 25 to 29.9 is considered overweight. ? A BMI of 30 and above is considered obese.  Watch levels of cholesterol and blood lipids  You should start having your blood tested for lipids and cholesterol at 28 years of age, then have this test every 5 years.  You may need to have your cholesterol levels checked more often if: ? Your lipid or cholesterol levels are high. ? You are older than 28 years of age. ? You are at high risk for heart disease.  Cancer screening Lung Cancer  Lung  cancer screening is recommended for adults 75-77 years old who are at high risk for lung cancer because of a history of smoking.  A yearly low-dose CT scan of the lungs is recommended for people who: ? Currently smoke. ? Have quit within the past 15 years. ? Have at least a 30-pack-year history of smoking. A pack year is smoking an average of one pack of cigarettes a day for 1 year.  Yearly screening should continue until it has been 15 years since you quit.  Yearly screening should stop if you develop a health problem that would prevent you from having lung cancer treatment.  Breast Cancer  Practice breast self-awareness. This means understanding how your breasts normally appear and feel.  It also means doing regular breast self-exams. Let your health care provider know about any changes, no matter how small.  If you are in your 20s or 30s, you should have a clinical breast exam (CBE) by a health care provider every 1-3 years as part of a  regular health exam.  If you are 65 or older, have a CBE every year. Also consider having a breast X-ray (mammogram) every year.  If you have a family history of breast cancer, talk to your health care provider about genetic screening.  If you are at high risk for breast cancer, talk to your health care provider about having an MRI and a mammogram every year.  Breast cancer gene (BRCA) assessment is recommended for women who have family members with BRCA-related cancers. BRCA-related cancers include: ? Breast. ? Ovarian. ? Tubal. ? Peritoneal cancers.  Results of the assessment will determine the need for genetic counseling and BRCA1 and BRCA2 testing.  Cervical Cancer Your health care provider may recommend that you be screened regularly for cancer of the pelvic organs (ovaries, uterus, and vagina). This screening involves a pelvic examination, including checking for microscopic changes to the surface of your cervix (Pap test). You may be encouraged to have this screening done every 3 years, beginning at age 83.  For women ages 36-65, health care providers may recommend pelvic exams and Pap testing every 3 years, or they may recommend the Pap and pelvic exam, combined with testing for human papilloma virus (HPV), every 5 years. Some types of HPV increase your risk of cervical cancer. Testing for HPV may also be done on women of any age with unclear Pap test results.  Other health care providers may not recommend any screening for nonpregnant women who are considered low risk for pelvic cancer and who do not have symptoms. Ask your health care provider if a screening pelvic exam is right for you.  If you have had past treatment for cervical cancer or a condition that could lead to cancer, you need Pap tests and screening for cancer for at least 20 years after your treatment. If Pap tests have been discontinued, your risk factors (such as having a new sexual partner) need to be reassessed to  determine if screening should resume. Some women have medical problems that increase the chance of getting cervical cancer. In these cases, your health care provider may recommend more frequent screening and Pap tests.  Colorectal Cancer  This type of cancer can be detected and often prevented.  Routine colorectal cancer screening usually begins at 28 years of age and continues through 28 years of age.  Your health care provider may recommend screening at an earlier age if you have risk factors for colon cancer.  Your health  care provider may also recommend using home test kits to check for hidden blood in the stool.  A small camera at the end of a tube can be used to examine your colon directly (sigmoidoscopy or colonoscopy). This is done to check for the earliest forms of colorectal cancer.  Routine screening usually begins at age 35.  Direct examination of the colon should be repeated every 5-10 years through 28 years of age. However, you may need to be screened more often if early forms of precancerous polyps or small growths are found.  Skin Cancer  Check your skin from head to toe regularly.  Tell your health care provider about any new moles or changes in moles, especially if there is a change in a mole's shape or color.  Also tell your health care provider if you have a mole that is larger than the size of a pencil eraser.  Always use sunscreen. Apply sunscreen liberally and repeatedly throughout the day.  Protect yourself by wearing long sleeves, pants, a wide-brimmed hat, and sunglasses whenever you are outside.  Heart disease, diabetes, and high blood pressure  High blood pressure causes heart disease and increases the risk of stroke. High blood pressure is more likely to develop in: ? People who have blood pressure in the high end of the normal range (130-139/85-89 mm Hg). ? People who are overweight or obese. ? People who are African American.  If you are 18-39 years  of age, have your blood pressure checked every 3-5 years. If you are 82 years of age or older, have your blood pressure checked every year. You should have your blood pressure measured twice-once when you are at a hospital or clinic, and once when you are not at a hospital or clinic. Record the average of the two measurements. To check your blood pressure when you are not at a hospital or clinic, you can use: ? An automated blood pressure machine at a pharmacy. ? A home blood pressure monitor.  If you are between 73 years and 57 years old, ask your health care provider if you should take aspirin to prevent strokes.  Have regular diabetes screenings. This involves taking a blood sample to check your fasting blood sugar level. ? If you are at a normal weight and have a low risk for diabetes, have this test once every three years after 28 years of age. ? If you are overweight and have a high risk for diabetes, consider being tested at a younger age or more often. Preventing infection Hepatitis B  If you have a higher risk for hepatitis B, you should be screened for this virus. You are considered at high risk for hepatitis B if: ? You were born in a country where hepatitis B is common. Ask your health care provider which countries are considered high risk. ? Your parents were born in a high-risk country, and you have not been immunized against hepatitis B (hepatitis B vaccine). ? You have HIV or AIDS. ? You use needles to inject street drugs. ? You live with someone who has hepatitis B. ? You have had sex with someone who has hepatitis B. ? You get hemodialysis treatment. ? You take certain medicines for conditions, including cancer, organ transplantation, and autoimmune conditions.  Hepatitis C  Blood testing is recommended for: ? Everyone born from [birth year] through 1967. ? Anyone with known risk factors for hepatitis C.  Sexually transmitted infections (STIs)  You should be screened for  sexually  transmitted infections (STIs) including gonorrhea and chlamydia if: ? You are sexually active and are younger than 28 years of age. ? You are older than 28 years of age and your health care provider tells you that you are at risk for this type of infection. ? Your sexual activity has changed since you were last screened and you are at an increased risk for chlamydia or gonorrhea. Ask your health care provider if you are at risk.  If you do not have HIV, but are at risk, it may be recommended that you take a prescription medicine daily to prevent HIV infection. This is called pre-exposure prophylaxis (PrEP). You are considered at risk if: ? You are sexually active and do not regularly use condoms or know the HIV status of your partner(s). ? You take drugs by injection. ? You are sexually active with a partner who has HIV.  Talk with your health care provider about whether you are at high risk of being infected with HIV. If you choose to begin PrEP, you should first be tested for HIV. You should then be tested every 3 months for as long as you are taking PrEP. Pregnancy  If you are premenopausal and you may become pregnant, ask your health care provider about preconception counseling.  If you may become pregnant, take 400 to 800 micrograms (mcg) of folic acid every day.  If you want to prevent pregnancy, talk to your health care provider about birth control (contraception). Osteoporosis and menopause  Osteoporosis is a disease in which the bones lose minerals and strength with aging. This can result in serious bone fractures. Your risk for osteoporosis can be identified using a bone density scan.  If you are 72 years of age or older, or if you are at risk for osteoporosis and fractures, ask your health care provider if you should be screened.  Ask your health care provider whether you should take a calcium or vitamin D supplement to lower your risk for osteoporosis.  Menopause may  have certain physical symptoms and risks.  Hormone replacement therapy may reduce some of these symptoms and risks. Talk to your health care provider about whether hormone replacement therapy is right for you. Follow these instructions at home:  Schedule regular health, dental, and eye exams.  Stay current with your immunizations.  Do not use any tobacco products including cigarettes, chewing tobacco, or electronic cigarettes.  If you are pregnant, do not drink alcohol.  If you are breastfeeding, limit how much and how often you drink alcohol.  Limit alcohol intake to no more than 1 drink per day for nonpregnant women. One drink equals 12 ounces of beer, 5 ounces of wine, or 1 ounces of hard liquor.  Do not use street drugs.  Do not share needles.  Ask your health care provider for help if you need support or information about quitting drugs.  Tell your health care provider if you often feel depressed.  Tell your health care provider if you have ever been abused or do not feel safe at home. This information is not intended to replace advice given to you by your health care provider. Make sure you discuss any questions you have with your health care provider. Document Released: 08/12/2010 Document Revised: 07/05/2015 Document Reviewed: 10/31/2014 Elsevier Interactive Patient Education  2018 [employer].  IF you received an x-ray today, you will receive an invoice from [facility]. Please contact [facility] at [phone] with questions or concerns regarding your invoice.  IF you received labwork today, you will receive an invoice from [facility]. Please contact [facility] at [phone] with questions or concerns regarding your invoice.   Our billing staff will not be able to assist you with questions regarding bills from these companies.  You will be contacted with the lab results as soon as they are available. The fastest way to get your results is to  activate your My Chart account. Instructions are located on the last page of this paperwork. If you have not heard from [place] regarding the results in 2 weeks, please contact this office.    Influenza (Flu) Vaccine (Inactivated or Recombinant): What You Need to Know 1. Why get vaccinated? Influenza ("flu") is a contagious disease that spreads around the [place] every year, usually between October and May. Flu is caused by influenza viruses, and is spread mainly by coughing, sneezing, and close contact. Anyone can get flu. Flu strikes suddenly and can last several days. Symptoms vary by age, but can include:  fever/chills  sore throat  muscle aches  fatigue  cough  headache  runny or stuffy nose  Flu can also lead to pneumonia and blood infections, and cause diarrhea and seizures in children. If you have a medical condition, such as heart or lung disease, flu can make it worse. Flu is more dangerous for some people. Infants and young children, people 52 years of age and older, pregnant women, and people with certain health conditions or a weakened immune system are at greatest risk. Each year thousands of people in the [place] die from flu, and many more are hospitalized. Flu vaccine can:  keep you from getting flu,  make flu less severe if you do get it, and  keep you from spreading flu to your family and other people. 2. Inactivated and recombinant flu vaccines A dose of flu vaccine is recommended every flu season. Children 6 months through 58 years of age may need two doses during the same flu season. Everyone else needs only one dose each flu season. Some inactivated flu vaccines contain a very small amount of a mercury-based preservative called thimerosal. Studies have not shown thimerosal in vaccines to be harmful, but flu vaccines that do not contain thimerosal are available. There is no live flu virus in flu shots. They cannot cause the flu. There are many flu  viruses, and they are always changing. Each year a new flu vaccine is made to protect against three or four viruses that are likely to cause disease in the upcoming flu season. But even when the vaccine doesn't exactly match these viruses, it may still provide some protection. Flu vaccine cannot prevent:  flu that is caused by a virus not covered by the vaccine, or  illnesses that look like flu but are not.  It takes about 2 weeks for protection to develop after vaccination, and protection lasts through the flu season. 3. Some people should not get this vaccine Tell the person who is giving you the vaccine:  If you have any severe, life-threatening allergies. If you ever had a life-threatening allergic reaction after a dose of flu vaccine, or have a severe allergy to any part of this vaccine, you may be advised not to get vaccinated. Most, but not all, types of flu vaccine contain a small amount of egg protein.  If you ever had Guillain-Barr Syndrome (also called GBS). Some people with a history of GBS should not get this vaccine. This should be discussed with your  doctor.  If you are not feeling well. It is usually okay to get flu vaccine when you have a mild illness, but you might be asked to come back when you feel better.  4. Risks of a vaccine reaction With any medicine, including vaccines, there is a chance of reactions. These are usually mild and go away on their own, but serious reactions are also possible. Most people who get a flu shot do not have any problems with it. Minor problems following a flu shot include:  soreness, redness, or swelling where the shot was given  hoarseness  sore, red or itchy eyes  cough  fever  aches  headache  itching  fatigue  If these problems occur, they usually begin soon after the shot and last 1 or 2 days. More serious problems following a flu shot can include the following:  There may be a small increased risk of Guillain-Barre  Syndrome (GBS) after inactivated flu vaccine. This risk has been estimated at 1 or 2 additional cases per million people vaccinated. This is much lower than the risk of severe complications from flu, which can be prevented by flu vaccine.  Young children who get the flu shot along with pneumococcal vaccine (PCV13) and/or DTaP vaccine at the same time might be slightly more likely to have a seizure caused by fever. Ask your doctor for more information. Tell your doctor if a child who is getting flu vaccine has ever had a seizure.  Problems that could happen after any injected vaccine:  People sometimes faint after a medical procedure, including vaccination. Sitting or lying down for about 15 minutes can help prevent fainting, and injuries caused by a fall. Tell your doctor if you feel dizzy, or have vision changes or ringing in the ears.  Some people get severe pain in the shoulder and have difficulty moving the arm where a shot was given. This happens very rarely.  Any medication can cause a severe allergic reaction. Such reactions from a vaccine are very rare, estimated at about 1 in a million doses, and would happen within a few minutes to a few hours after the vaccination. As with any medicine, there is a very remote chance of a vaccine causing a serious injury or death. The safety of vaccines is always being monitored. For more information, visit: http://www.aguilar.org/ 5. What if there is a serious reaction? What should I look for? Look for anything that concerns you, such as signs of a severe allergic reaction, very high fever, or unusual behavior. Signs of a severe allergic reaction can include hives, swelling of the face and throat, difficulty breathing, a fast heartbeat, dizziness, and weakness. These would start a few minutes to a few hours after the vaccination. What should I do?  If you think it is a severe allergic reaction or other emergency that can't wait, call 9-1-1 and get  the person to the nearest hospital. Otherwise, call your doctor.  Reactions should be reported to the Vaccine Adverse Event Reporting System (VAERS). Your doctor should file this report, or you can do it yourself through the VAERS web site at www.vaers.SamedayNews.es, or by calling 3030760997. ? VAERS does not give medical advice. 6. The National Vaccine Injury Compensation Program The [employer] Vaccine Injury Compensation Program (VICP) is a federal program that was created to compensate people who may have been injured by certain vaccines. Persons who believe they may have been injured by a vaccine can learn about the program and about filing a  claim by calling (405) 693-1743 or visiting the [employer] website at GoldCloset.com.ee. There is a time limit to file a claim for compensation. 7. How can I learn more?  Ask your healthcare provider. He or she can give you the vaccine package insert or suggest other sources of information.  Call your local or state health department.  Contact the Centers for Disease Control and Prevention (CDC): ? Call (802)695-2449 (1-800-CDC-INFO) or ? Visit CDC's website at https://gibson.com/ Vaccine Information Statement, Inactivated Influenza Vaccine (09/16/2013) This information is not intended to replace advice given to you by your health care provider. Make sure you discuss any questions you have with your health care provider. Document Released: 11/21/2005 Document Revised: 10/18/2015 Document Reviewed: 10/18/2015 Elsevier Interactive Patient Education  2017 [employer].

## 2016-11-26 NOTE — Progress Notes (Signed)
[facility] at [address], [place] [address] [phone]  Date:  11/26/2016   Name:  Cheryl Hansen   DOB:  1990/12/02   MRN:  [id number]  PCP:  Dorise Hiss, PA-C    Chief Complaint: Annual Exam (w/pap)   History of Present Illness:  This is a pleasant G0P0 26 y.o. female with PMH HTN and colotis who is presenting for CPE. 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   Colitis - Takes mesalamine 1012m suppository PRN. She was diagnosed with colitis while [place] in [place] several years ago. Evaluated by [facility] 01/2016; Colonoscopy 02/2016 which was negative. She has not had a flare in 2018  HTN - Blood pressure today is 124/90. Propranolol 631mqd. She is not checking at home. She was treated for HTN in high school with Propranolol. She has siblings, none have HTN. She believes she has been evaluated for renal artery stenosis in the past, however cannot recall.   Complaints:  Irregular periods "spotting" sometimes are light, sometimes are heavy, sometimes are late.  Weight gain. 10lbs in 1 year. Wonders if she has PCOS.  LMP: Oct 7 Contraception: none. Stopped implanon one year ago.  Last pap: "it's been a while"  Sexual history: active. One partner. She is trying to get pregnant.  Immunizations: needs t-dap and flu shot Dentist: not in a long time.  Eye: R 20/25, L 20/25 Diet/Exercise: she goes to the gym 2-3 days/week. Cutting back on fast food and pork. Tries not to eat late at night.   Fam hx: Mother HLD; Father HTN; grandparents HTN; grandmother diagnosed with colon cancer in her 5069sTobacco/alcohol/substance use: none   Review of Systems:  Review of Systems  Constitutional: Positive for unexpected weight change. Negative for chills, diaphoresis, fatigue and fever.  HENT: Negative for congestion, postnasal drip, rhinorrhea, sinus pressure, sneezing and sore throat.   Respiratory: Negative for cough, chest tightness,  shortness of breath and wheezing.   Cardiovascular: Negative for chest pain and palpitations.  Gastrointestinal: Negative for abdominal pain, diarrhea, nausea and vomiting.  Genitourinary: Positive for menstrual problem. Negative for decreased urine volume, difficulty urinating, dysuria, enuresis, flank pain, frequency, hematuria and urgency.  Musculoskeletal: Negative for back pain.  Neurological: Negative for dizziness, weakness, light-headedness and headaches.    There are no active problems to display for this patient.   Prior to Admission medications   Medication Sig Start Date End Date Taking? Authorizing Provider  Multiple Vitamin (MULTIVITAMIN) tablet Take 1 tablet by mouth daily.   Yes [provider]  propranolol ER (INDERAL LA) 60 MG 24 hr capsule Take 1 capsule (60 mg total) by mouth daily. 07/02/16  Yes Latavius Capizzi, ElGelene MinkPA-C    No Known Allergies  No past surgical history on file.  Social History  Substance Use Topics  . Smoking status: Former SmResearch scientist (life sciences). Smokeless tobacco: Never Used  . Alcohol use No    Family History  Problem Relation Age of Onset  . Hypertension Maternal Grandmother   . Hypertension Paternal Grandfather   . Hyperlipidemia Mother   . Cancer Paternal Grandmother   . Colitis Paternal Grandmother     Medication list has been reviewed and updated.  Physical Examination:  Physical Exam  Constitutional: She is oriented to person, place, and time. She appears well-developed and well-nourished. No distress.  HENT:  Head: Normocephalic and atraumatic.  Mouth/Throat: Oropharynx is clear and moist.  Eyes: Pupils are  equal, round, and reactive to light. Conjunctivae and EOM are normal.  Neck: Neck supple. No thyromegaly present.  Cardiovascular: Normal rate, regular rhythm and normal heart sounds.   No murmur heard. Pulmonary/Chest: Effort normal and breath sounds normal. She has no wheezes.  Abdominal: Soft. She exhibits no  distension. There is no tenderness. There is no guarding.  Genitourinary: Vagina normal and uterus normal. Cervix exhibits no motion tenderness, no discharge and no friability. Right adnexum displays no mass and no tenderness. Left adnexum displays no mass and no tenderness.  Musculoskeletal: Normal range of motion.  Neurological: She is alert and oriented to person, place, and time. She has normal reflexes.  Skin: Skin is warm and dry.  Psychiatric: She has a normal mood and affect. Her behavior is normal. Judgment and thought content normal.  Vitals reviewed.   BP (!) 130/92   Pulse (!) 58   Temp 98 F (36.7 C) (Oral)   Resp 16   Ht 5' 0.5" (1.537 m)   Wt 144 lb 3.2 oz (65.4 kg)   LMP 11/16/2016   SpO2 99%   BMI 27.70 kg/m   Results for orders placed or performed in visit on 11/26/16  POCT urinalysis dipstick  Result Value Ref Range   Color, UA yellow yellow   Clarity, UA clear clear   Glucose, UA negative negative mg/dL   Bilirubin, UA negative negative   Ketones, POC UA negative negative mg/dL   Spec Grav, UA <=1.005 (A) 1.010 - 1.025   Blood, UA negative negative   pH, UA 6.0 5.0 - 8.0   Protein Ur, POC negative negative mg/dL   Urobilinogen, UA 0.2 0.2 or 1.0 E.U./dL   Nitrite, UA Negative Negative   Leukocytes, UA Negative Negative  POCT urine pregnancy  Result Value Ref Range   Preg Test, Ur Negative Negative   02/2016 Colonoscopy Impression:  - The terminal ileum appeared normal. - External hemorrhoids were found. The hemorrhoids were small. - The exam was otherwise without abnormality on direct and retroflexion views.  Assessment and Plan: 1. Annual physical exam - Pt is a G56P7 26 year old female desiring pregnancy "before I'm 67" here for annual exam. HTN controlled with Propranolol 76m qd. She is working on her diet and exercise. She will track down medical records to see if she has been evaluated for RAS in the past. Recent colonoscopy is negative for  colitis. GYN referral made for irregular periods. Labs are pending, will contact with results. Anticipatory guidance provided.   2. Irregular periods - Ambulatory referral to Gynecology - POCT urine pregnancy  3. Screening for cervical cancer 4. Encounter for gynecological examination without abnormal finding - Pap IG, CT/NG w/ reflex HPV when ASC-U  5. Possible exposure to STD - HIV antibody - RPR - Hepatitis C antibody  6. Weight gain - Testosterone  7. Screening for deficiency anemia - CBC with Differential/Platelet  8. Screening, lipid - Lipid panel  9. Screening for endocrine disorder - CMP14+EGFR - POCT urinalysis dipstick  10. Screening for thyroid disorder - TSH  11. Need for prophylactic vaccination and inoculation against influenza - Flu Vaccine QUAD 36+ mos IM   WMercer Pod PA-C  [facility]/17/2018 8:15 AM

## 2016-11-27 LAB — TSH: TSH: 1.2 u[IU]/mL (ref 0.450–4.500)

## 2016-11-27 LAB — CBC WITH DIFFERENTIAL/PLATELET
Basophils Absolute: 0 10*3/uL (ref 0.0–0.2)
Basos: 1 %
EOS (ABSOLUTE): 0 10*3/uL (ref 0.0–0.4)
Eos: 1 %
Hematocrit: 40.2 % (ref 34.0–46.6)
Hemoglobin: 12.6 g/dL (ref 11.1–15.9)
Immature Grans (Abs): 0 10*3/uL (ref 0.0–0.1)
Immature Granulocytes: 0 %
Lymphocytes Absolute: 1.7 10*3/uL (ref 0.7–3.1)
Lymphs: 45 %
MCH: 26.7 pg (ref 26.6–33.0)
MCHC: 31.3 g/dL — ABNORMAL LOW (ref 31.5–35.7)
MCV: 85 fL (ref 79–97)
Monocytes Absolute: 0.3 10*3/uL (ref 0.1–0.9)
Monocytes: 9 %
Neutrophils Absolute: 1.6 10*3/uL (ref 1.4–7.0)
Neutrophils: 44 %
Platelets: 255 10*3/uL (ref 150–379)
RBC: 4.72 x10E6/uL (ref 3.77–5.28)
RDW: 14.3 % (ref 12.3–15.4)
WBC: 3.8 10*3/uL (ref 3.4–10.8)

## 2016-11-27 LAB — CMP14+EGFR
ALT: 12 IU/L (ref 0–32)
AST: 15 IU/L (ref 0–40)
Albumin/Globulin Ratio: 1.4 (ref 1.2–2.2)
Albumin: 3.9 g/dL (ref 3.5–5.5)
Alkaline Phosphatase: 65 IU/L (ref 39–117)
BUN/Creatinine Ratio: 10 (ref 9–23)
BUN: 10 mg/dL (ref 6–20)
Bilirubin Total: 0.3 mg/dL (ref 0.0–1.2)
CO2: 24 mmol/L (ref 20–29)
Calcium: 8.5 mg/dL — ABNORMAL LOW (ref 8.7–10.2)
Chloride: 101 mmol/L (ref 96–106)
Creatinine, Ser: 0.97 mg/dL (ref 0.57–1.00)
GFR calc Af Amer: 92 mL/min/{1.73_m2} (ref >59)
GFR calc non Af Amer: 80 mL/min/{1.73_m2} (ref >59)
Globulin, Total: 2.7 g/dL (ref 1.5–4.5)
Glucose: 94 mg/dL (ref 65–99)
Potassium: 4.6 mmol/L (ref 3.5–5.2)
Sodium: 137 mmol/L (ref 134–144)
Total Protein: 6.6 g/dL (ref 6.0–8.5)

## 2016-11-27 LAB — TESTOSTERONE: Testosterone: 66 ng/dL — ABNORMAL HIGH (ref 8–48)

## 2016-11-27 LAB — LIPID PANEL
Chol/HDL Ratio: 3.4 ratio (ref 0.0–4.4)
Cholesterol, Total: 155 mg/dL (ref 100–199)
HDL: 45 mg/dL (ref >39)
LDL Calculated: 95 mg/dL (ref 0–99)
Triglycerides: 75 mg/dL (ref 0–149)
VLDL Cholesterol Cal: 15 mg/dL (ref 5–40)

## 2016-11-27 LAB — HEPATITIS C ANTIBODY: Hep C Virus Ab: <0.1 {s_co_ratio} (ref 0.0–0.9)

## 2016-11-27 LAB — HIV ANTIBODY (ROUTINE TESTING W REFLEX): HIV Screen 4th Generation wRfx: NONREACTIVE

## 2016-11-27 LAB — RPR: RPR Ser Ql: NONREACTIVE

## 2016-11-28 LAB — PAP IG, CT-NG, RFX HPV ASCU
Chlamydia, Nuc. Acid Amp: NEGATIVE
Gonococcus by Nucleic Acid Amp: NEGATIVE
PAP Smear Comment: 0

## 2016-12-01 ENCOUNTER — Encounter: Payer: Self-pay | Admitting: Family Medicine

## 2016-12-01 ENCOUNTER — Ambulatory Visit (INDEPENDENT_AMBULATORY_CARE_PROVIDER_SITE_OTHER): Payer: 59 | Admitting: Family Medicine

## 2016-12-01 VITALS — BP 120/60 | HR 65 | Temp 98.6°F | Resp 16 | Ht 60.5 in | Wt 143.4 lb

## 2016-12-01 DIAGNOSIS — Z23 Encounter for immunization: Secondary | ICD-10-CM

## 2016-12-01 DIAGNOSIS — H0100B Unspecified blepharitis left eye, upper and lower eyelids: Secondary | ICD-10-CM | POA: Diagnosis not present

## 2016-12-01 DIAGNOSIS — Z283 Underimmunization status: Secondary | ICD-10-CM

## 2016-12-01 DIAGNOSIS — H0100A Unspecified blepharitis right eye, upper and lower eyelids: Secondary | ICD-10-CM

## 2016-12-01 DIAGNOSIS — Z2839 Other underimmunization status: Secondary | ICD-10-CM

## 2016-12-01 MED ORDER — OLOPATADINE HCL 0.1 % OP SOLN: 1.0000 [drp] | Freq: Two times a day (BID) | OPHTHALMIC | 0 refills | Status: DC

## 2016-12-01 NOTE — Progress Notes (Signed)
Patient ID: Cheryl Hansen, female    DOB: 03/30/1990  Age: 26 y.o. MRN: [id number]  Chief Complaint  Patient presents with  . Eye Problem    BOTH - SWOLLEN with itching this morning    Subjective:   26 year old lady who woke in this morning with both upper and lower eyelids right and left swollen. They itched a little bit. She used a scrub on her face last night. Does not know of any other allergen exposure. Has not had this before. She has had conjunctivitis a long time ago.  Current allergies, medications, problem list, past/family and social histories reviewed.  Objective:  BP 120/60 (BP Location: Right Arm, Patient Position: Sitting, Cuff Size: Large)   Pulse 65   Temp 98.6 F (37 C) (Oral)   Resp 16   Ht 5' 0.5" (1.537 m)   Wt 143 lb 6.4 oz (65 kg)   LMP 11/16/2016   SpO2 97%   BMI 27.54 kg/m   Pleasant lady, alert and oriented. TMs normal. Throat clear. Neck supple without significant nodes. Eyes not erythematous. EOMs intact. Fundi benign. Upper and lower lids are both slightly puffy. Does not wear glasses or contacts.  Assessment & Plan:   Assessment: 1. Blepharitis of upper and lower eyelids of both eyes, unspecified type   2. Immunization deficiency       Plan: This appears to be more just a blepharitis. Will treat for allergic process. Return if further problems. She wants her DPT shot today. She is hardly had her flu shot.  Orders Placed This Encounter  Procedures  . Tdap vaccine greater than or equal to 7yo IM    Meds ordered this encounter  Medications  . olopatadine (PATANOL) 0.1 % ophthalmic solution    Sig: Place 1 drop into both eyes 2 (two) times daily.    Dispense:  5 mL    Refill:  0         Patient Instructions   Use 1 drom each eye twice daily for 3-4 days  Take Allegra or Claritin  Daily   Tetanus shot today  Return if further problems  Good handwashing.    IF you received an x-ray today, you will receive an invoice from  [facility]. Please contact [facility] at [phone] with questions or concerns regarding your invoice.   IF you received labwork today, you will receive an invoice from [facility]. Please contact [facility] at [phone] with questions or concerns regarding your invoice.   Our billing staff will not be able to assist you with questions regarding bills from these companies.  You will be contacted with the lab results as soon as they are available. The fastest way to get your results is to activate your My Chart account. Instructions are located on the last page of this paperwork. If you have not heard from us regarding the results in 2 weeks, please contact this office.        Return if symptoms worsen or fail to improve.   Keshaun Dubey, MD 12/01/2016

## 2016-12-01 NOTE — Patient Instructions (Addendum)
Use 1 drom each eye twice daily for 3-4 days  Take Allegra or Claritin  Daily   Tetanus shot today  Return if further problems  Good handwashing.    IF you received an x-ray today, you will receive an invoice from [facility]. Please contact [facility] at [phone] with questions or concerns regarding your invoice.   IF you received labwork today, you will receive an invoice from [facility]. Please contact [facility] at [phone] with questions or concerns regarding your invoice.   Our billing staff will not be able to assist you with questions regarding bills from these companies.  You will be contacted with the lab results as soon as they are available. The fastest way to get your results is to activate your My Chart account. Instructions are located on the last page of this paperwork. If you have not heard from us regarding the results in 2 weeks, please contact this office.

## 2016-12-11 ENCOUNTER — Encounter: Payer: Self-pay | Admitting: Obstetrics & Gynecology

## 2016-12-11 ENCOUNTER — Ambulatory Visit (INDEPENDENT_AMBULATORY_CARE_PROVIDER_SITE_OTHER): Payer: 59 | Admitting: Obstetrics & Gynecology

## 2016-12-11 VITALS — BP 136/80 | Ht 60.0 in | Wt 146.0 lb

## 2016-12-11 DIAGNOSIS — N979 Female infertility, unspecified: Secondary | ICD-10-CM

## 2016-12-11 DIAGNOSIS — N914 Secondary oligomenorrhea: Secondary | ICD-10-CM

## 2016-12-11 NOTE — Progress Notes (Signed)
    Cheryl Hansen 09/25/1990 [id number]        26 y.o.  G0 Stable boyfriend x 8 years  RP:  Irregular periods/PCOS/Primary infertility  HPI:  Removed Implanon x about 2 years.  No contraception since then.  Menses currently every 1-3 months.  Flow mild to moderate.  No pelvic pain.  No dyspareunia.  Mictions/BMs wnl.  BMI 28.51.  Past medical history,surgical history, problem list, medications, allergies, family history and social history were all reviewed and documented in the EPIC chart.  Directed ROS with pertinent positives and negatives documented in the history of present illness/assessment and plan.  Exam:  Vitals:   12/11/16 1405  BP: 136/80  Weight: 146 lb (66.2 kg)  Height: 5' (1.524 m)   General appearance:  Normal  Gyn exam:  Vulva normal.  Speculum:  Cervix/Vagina normal.  Bimanual exam:  Uterus AV, NT, mobile, normal size.  No adnexal mass, NT.  Assessment/Plan:  26 y.o. G0  1. Secondary oligomenorrhea Normal gyn exam.  Will investigate hormonal disorders/PCOS which could result in Oligomenorrhea.  F/U Pelvic US to r/o Ovarian/Uterine pathology. 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  Will do labs today and f/u for Pelvic US.  May start on Clomiphen per Lab and Pelvic US results to stimulate ovulation. - Prolactin - TSH - HgB A1c - Testos,Total,Free and SHBG (Female) - US Transvaginal Non-OB; Future  Counseling on above issues >50% x 30 minutes  Genia DelMarie-Lyne Kealie Barrie MD, 2:17 PM 12/11/2016

## 2016-12-14 NOTE — Patient Instructions (Signed)
1. Secondary oligomenorrhea Normal gyn exam.  Will investigate hormonal disorders/PCOS which could result in Oligomenorrhea.  F/U Pelvic US to r/o Ovarian/Uterine pathology. 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  Will do labs today and f/u for Pelvic US.  May start on Clomiphen per Lab and Pelvic US results to stimulate ovulation. - Prolactin - TSH - HgB A1c - Testos,Total,Free and SHBG (Female) - US Transvaginal Non-OB; Future  Cheryl Hansen, it was a pleasure meeting you today!  I will inform you of your results as soon as available.  See you soon again for the Pelvic US.   Polycystic Ovarian Syndrome Polycystic ovarian syndrome (PCOS) is a common hormonal disorder among women of reproductive age. In most women with PCOS, many small fluid-filled sacs (cysts) grow on the ovaries, and the cysts are not part of a normal menstrual cycle. PCOS can cause problems with your menstrual periods and make it difficult to get pregnant. It can also cause an increased risk of miscarriage with pregnancy. If it is not treated, PCOS can lead to serious health problems, such as diabetes and heart disease. What are the causes? The cause of PCOS is not known, but it may be the result of a combination of certain factors, such as:  Irregular menstrual cycle.  High levels of certain hormones (androgens).  Problems with the hormone that helps to control blood sugar (insulin resistance).  Certain genes.  What increases the risk? This condition is more likely to develop in women who have a family history of PCOS. What are the signs or symptoms? Symptoms of PCOS may include:  Multiple ovarian cysts.  Infrequent periods or no periods.  Periods that are too frequent or too heavy.  Unpredictable periods.  Inability to get pregnant (infertility) because of not  ovulating.  Increased growth of hair on the face, chest, stomach, back, thumbs, thighs, or toes.  Acne or oily skin. Acne may develop during adulthood, and it may not respond to treatment.  Pelvic pain.  Weight gain or obesity.  Patches of thickened and dark brown or black skin on the neck, arms, breasts, or thighs (acanthosis nigricans).  Excess hair growth on the face, chest, abdomen, or upper thighs (hirsutism).  How is this diagnosed? This condition is diagnosed based on:  Your medical history.  A physical exam, including a pelvic exam. Your health care provider may look for areas of increased hair growth on your skin.  Tests, such as: ? Ultrasound. This may be used to examine the ovaries and the lining of the uterus (endometrium) for cysts. ? Blood tests. These may be used to check levels of sugar (glucose), female hormone (testosterone), and female hormones (estrogen and progesterone) in your blood.  How is this treated? There is no cure for PCOS, but treatment can help to manage symptoms and prevent more health problems from developing. Treatment varies depending on:  Your symptoms.  Whether you want to have a baby or whether you need birth control (contraception).  Treatment may include nutrition and lifestyle changes along with:  Progesterone hormone to start a menstrual period.  Birth control pills to help you have regular menstrual periods.  Medicines to make you ovulate, if you want to get pregnant.  Medicine to reduce excessive hair growth.  Surgery, in severe cases. This may involve making small holes in one or both of your ovaries.  This decreases the amount of testosterone that your body produces.  Follow these instructions at home:  Take over-the-counter and prescription medicines only as told by your health care provider.  Follow a healthy meal plan. This can help you reduce the effects of PCOS. ? Eat a healthy diet that includes lean proteins, complex  carbohydrates, fresh fruits and vegetables, low-fat dairy products, and healthy fats. Make sure to eat enough fiber.  If you are overweight, lose weight as told by your health care provider. ? Losing 10% of your body weight may improve symptoms. ? Your health care provider can determine how much weight loss is best for you and can help you lose weight safely.  Keep all follow-up visits as told by your health care provider. This is important. Contact a health care provider if:  Your symptoms do not get better with medicine.  You develop new symptoms. This information is not intended to replace advice given to you by your health care provider. Make sure you discuss any questions you have with your health care provider. Document Released: 05/23/2004 Document Revised: 09/25/2015 Document Reviewed: 07/15/2015 Elsevier Interactive Patient Education  [employer].

## 2016-12-17 LAB — TESTOS,TOTAL,FREE AND SHBG (FEMALE)
Free Testosterone: 10 pg/mL — ABNORMAL HIGH (ref 0.1–6.4)
SEX HORMONE BINDING: 29 nmol/L (ref 17–124)
TESTOSTERONE, TOTAL, LC-MS-MS: 67 ng/dL — AB (ref 2–45)

## 2016-12-17 LAB — HEMOGLOBIN A1C
Hgb A1c MFr Bld: 5.5 % of total Hgb (ref <5.7)
Mean Plasma Glucose: 111 (calc)
eAG (mmol/L): 6.2 (calc)

## 2016-12-17 LAB — TSH: TSH: 0.91 m[IU]/L

## 2016-12-17 LAB — PROLACTIN: PROLACTIN: 5.7 ng/mL

## 2017-01-07 ENCOUNTER — Encounter: Payer: Self-pay | Admitting: Obstetrics & Gynecology

## 2017-01-07 ENCOUNTER — Ambulatory Visit: Payer: 59 | Admitting: Obstetrics & Gynecology

## 2017-01-07 ENCOUNTER — Ambulatory Visit (INDEPENDENT_AMBULATORY_CARE_PROVIDER_SITE_OTHER): Payer: 59

## 2017-01-07 VITALS — BP 146/90

## 2017-01-07 DIAGNOSIS — E282 Polycystic ovarian syndrome: Secondary | ICD-10-CM | POA: Diagnosis not present

## 2017-01-07 DIAGNOSIS — N914 Secondary oligomenorrhea: Secondary | ICD-10-CM

## 2017-01-07 DIAGNOSIS — N970 Female infertility associated with anovulation: Secondary | ICD-10-CM

## 2017-01-07 DIAGNOSIS — N979 Female infertility, unspecified: Secondary | ICD-10-CM | POA: Diagnosis not present

## 2017-01-07 MED ORDER — CLOMIPHENE CITRATE 50 MG PO TABS: 50.0000 mg | ORAL_TABLET | Freq: Every day | ORAL | 0 refills | Status: DC

## 2017-01-07 MED ORDER — MEDROXYPROGESTERONE ACETATE 5 MG PO TABS: 5.0000 mg | ORAL_TABLET | Freq: Every day | ORAL | 0 refills | Status: DC

## 2017-01-07 NOTE — Progress Notes (Signed)
    Cheryl Hansen 06/30/1990 [id number]        26 y.o.  G0 Stable boyfriend x 8 years  RP:  Oligomenorrhea for Pelvic US  HPI:  No change x last visit 12/11/2016:  Removed Implanon x about 2 years.  No contraception since then.  Menses currently every 1-3 months.  Flow mild to moderate.  No pelvic pain.  No dyspareunia.  Mictions/BMs wnl.  BMI 28.51.   Past medical history,surgical history, problem list, medications, allergies, family history and social history were all reviewed and documented in the EPIC chart.  Directed ROS with pertinent positives and negatives documented in the history of present illness/assessment and plan.  Exam:  Vitals:   01/07/17 0923  BP: (!) 146/90   General appearance:  Normal  Pelvic US today: T/V uterus anteverted and homogeneous measuring 7.84 x 4.81 x 2.90 cm.  Endometrial line measuring 8.7 mm.  Small solid cystic areas with negative color flow Doppler.  Right and left ovaries with numerous tiny follicles less than 4 mm.  Bilateral increase in ovarian volume compatible with PCOS.  No apparent mass in the right or left adnexa.  No free fluid in the posterior cul-de-sac.   Assessment/Plan:  26 y.o. 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  Pelvic ultrasound findings suggestive of PCOS.  Endometrial lining normal at 8.7 mm.  Patient reassured.  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  Will start with Clomid stimulation.  Will bring a withdrawal bleeding with Provera after a negative urine home pregnancy test in 2 weeks.  Will start Clomid on day 3 of that menstrual period.  Timed intercourse recommended every 2 days, but could do every day if prefers.  Will do urine LH ovulatory tests and will come for a day 21 progesterone level to confirm a good ovulation.  Patient voiced understanding and agreement with plan.    3. PCO (polycystic ovaries) As above.      Counseling on the above issues more than 50% for 15 minutes.  Genia DelMarie-Lyne Raivyn Kabler MD, 9:44 AM 01/07/2017

## 2017-01-10 ENCOUNTER — Encounter: Payer: Self-pay | Admitting: Obstetrics & Gynecology

## 2017-01-10 NOTE — Patient Instructions (Signed)
1. 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  Pelvic ultrasound findings suggestive of PCOS.  Endometrial lining normal at 8.7 mm.  Patient reassured.  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  Will start with Clomid stimulation.  Will bring a withdrawal bleeding with Provera after a negative urine home pregnancy test in 2 weeks.  Will start Clomid on day 3 of that menstrual period.  Timed intercourse recommended every 2 days, but could do every day if prefers.  Will do urine LH ovulatory tests and will come for a day 21 progesterone level to confirm a good ovulation.   Patient is on prenatal vitamins. Patient voiced understanding and agreement with plan.    3. PCO (polycystic ovaries) As above.    Cheryl Hansen, it was a pleasure seeing you today!

## 2017-08-25 ENCOUNTER — Encounter: Payer: Self-pay | Admitting: Urgent Care

## 2017-08-25 ENCOUNTER — Ambulatory Visit: Payer: 59 | Admitting: Urgent Care

## 2017-08-25 VITALS — BP 134/90 | HR 72 | Temp 98.3°F | Resp 17 | Ht 60.0 in | Wt 154.0 lb

## 2017-08-25 DIAGNOSIS — I10 Essential (primary) hypertension: Secondary | ICD-10-CM

## 2017-08-25 DIAGNOSIS — Z87891 Personal history of nicotine dependence: Secondary | ICD-10-CM

## 2017-08-25 DIAGNOSIS — R030 Elevated blood-pressure reading, without diagnosis of hypertension: Secondary | ICD-10-CM

## 2017-08-25 MED ORDER — AMLODIPINE BESYLATE 5 MG PO TABS: 5.0000 mg | ORAL_TABLET | Freq: Every day | ORAL | 3 refills | Status: DC

## 2017-08-25 NOTE — Progress Notes (Signed)
    MRN: [id number] DOB: 05/29/1990  Subjective:   Cheryl Hansen is a 27 y.o. female presenting for follow up on Hypertension. Currently managed with propranolol. Patient is checking blood pressure at home, generally 130s/90s systolic. Avoids salt in diet. Reports intermittent stress headaches associated with school. Denies dizziness, chest pain, shortness of breath, heart racing, palpitations, nausea, vomiting, abdominal pain, hematuria, lower leg swelling. Denies smoking cigarettes. Denies history of arrhythmias.  Cheryl Hansen has a current medication list which includes the following prescription(s): clomiphene, medroxyprogesterone, multivitamin, and propranolol er. Also has No Known Allergies.  Cheryl Hansen  has a past medical history of Asthma, Colitis, and Hypertension. Denies past surgical history. Her family history includes Cancer (age of onset: 6050) in her paternal grandmother; Colitis in her paternal grandmother; Hyperlipidemia in her mother; Hypertension in her father, maternal grandmother, paternal grandfather, and paternal grandmother.   Objective:   Vitals: BP 134/90   Pulse 72   Temp 98.3 F (36.8 C) (Oral)   Resp 17   Ht 5' (1.524 m)   Wt 154 lb (69.9 kg)   SpO2 98%   BMI 30.08 kg/m   Physical Exam  Constitutional: She is oriented to person, place, and time. She appears well-developed and well-nourished.  HENT:  Mouth/Throat: Oropharynx is clear and moist.  Eyes: Pupils are equal, round, and reactive to light. EOM are normal. No scleral icterus.  Neck: Normal range of motion. Neck supple. No thyromegaly present.  Cardiovascular: Normal rate, regular rhythm, normal heart sounds and intact distal pulses. Exam reveals no gallop and no friction rub.  No murmur heard. Pulmonary/Chest: Effort normal and breath sounds normal. No stridor. No respiratory distress. She has no wheezes. She has no rales.  Neurological: She is alert and oriented to person, place, and time.  Skin: Skin is  warm and dry.  Psychiatric: She has a normal mood and affect.   Assessment and Plan :   Essential hypertension - Plan: Comprehensive metabolic panel, Lipid panel, Microalbumin / creatinine urine ratio  Elevated blood pressure reading  Will stop propranolol, start amlodipine for better blood pressure control. Counseled patient on potential for adverse effects with medications prescribed today, patient verbalized understanding. Discussed dietary modifications and patient will plan to start exercising. Monitoring parameters reviewed with instructions to rtc. Labs pending. Otherwise, f/u in 6 months.  Wallis BambergMario Lyndy Russman, PA-C [facility] [phone] 08/25/2017  8:05 AM

## 2017-08-25 NOTE — Patient Instructions (Addendum)
Please check your blood pressure once weekly. Try to check the blood pressure around the same time each day that you check it after a period of resting in a seated position for 5-10 minutes. The readings should be between 110-130 systolic (top number), between 70-90 diastolic (bottom number). If your blood pressure falls outside this range consistently (i.e. 3-4 weeks in a row), then come back for a recheck.     Salads - kale, spinach, cabbage, spring mix; use seeds like pumpkin seeds or sunflower seeds; you can also use 1-2 hard boiled eggs. Fruits - avocadoes, berries (blueberries, raspberries, blackberries), apples, oranges, pomegranate, grapefruit Seeds - quinoa, chia seeds; you can also incorporate oatmeal Vegetables - aspargus, cauliflower, broccoli, green beans, brussel spouts, bell peppers; stay away from starchy vegetables like potatoes, carrots, peas  Brussel sprouts - Cut off stems. Place in a mixing bowl that has a lid. Pour in a 1/4-1/2 cup olive oil, spices, use a light amount of parmesan. Place on a baking sheet. Bake for 10 minutes at 400F. Take it out, eat the brussel chips. Place for another 5-10 minutes.   Mashed cauliflower - Boil a bunch of cauliflower in a pot of water. Blend in a food processor with 1-2 tablespoons of butter.  Spaghetti squash -  Cut the squash in half very carefully, clean out seeds from the middle. Place 1/2 face down in a microwave safe dish with at least 2 inches of water. Make 4-6 slits on outside of spaghetti squash and microwave for 10-12 minutes. Take out the spaghetti using a metal spoon. Repeat for the other half.   Vega protein is good protein powder, make sure you use ~6 ice cubes to give it smoothie consistency together with ~4-6 ounces of vanilla soy milk. Throw cinnamon into your shake, use peanut butter. You can also use the fruits that I listed above. Throw spinach or kale into the shake.      Hypertension Hypertension, commonly called  high blood pressure, is when the force of blood pumping through the arteries is too strong. The arteries are the blood vessels that carry blood from the heart throughout the body. Hypertension forces the heart to work harder to pump blood and may cause arteries to become narrow or stiff. Having untreated or uncontrolled hypertension can cause heart attacks, strokes, kidney disease, and other problems. A blood pressure reading consists of a higher number over a lower number. Ideally, your blood pressure should be below 120/80. The first ("top") number is called the systolic pressure. It is a measure of the pressure in your arteries as your heart beats. The second ("bottom") number is called the diastolic pressure. It is a measure of the pressure in your arteries as the heart relaxes. What are the causes? The cause of this condition is not known. What increases the risk? Some risk factors for high blood pressure are under your control. Others are not. Factors you can change  Smoking.  Having type 2 diabetes mellitus, high cholesterol, or both.  Not getting enough exercise or physical activity.  Being overweight.  Having too much fat, sugar, calories, or salt (sodium) in your diet.  Drinking too much alcohol. Factors that are difficult or impossible to change  Having chronic kidney disease.  Having a family history of high blood pressure.  Age. Risk increases with age.  Race. You may be at higher risk if you are African-American.  Gender. Men are at higher risk than women before age 745. After age  65, women are at higher risk than men.  Having obstructive sleep apnea.  Stress. What are the signs or symptoms? Extremely high blood pressure (hypertensive crisis) may cause:  Headache.  Anxiety.  Shortness of breath.  Nosebleed.  Nausea and vomiting.  Severe chest pain.  Jerky movements you cannot control (seizures).  How is this diagnosed? This condition is diagnosed by  measuring your blood pressure while you are seated, with your arm resting on a surface. The cuff of the blood pressure monitor will be placed directly against the skin of your upper arm at the level of your heart. It should be measured at least twice using the same arm. Certain conditions can cause a difference in blood pressure between your right and left arms. Certain factors can cause blood pressure readings to be lower or higher than normal (elevated) for a short period of time:  When your blood pressure is higher when you are in a health care provider's office than when you are at home, this is called white coat hypertension. Most people with this condition do not need medicines.  When your blood pressure is higher at home than when you are in a health care provider's office, this is called masked hypertension. Most people with this condition may need medicines to control blood pressure.  If you have a high blood pressure reading during one visit or you have normal blood pressure with other risk factors:  You may be asked to return on a different day to have your blood pressure checked again.  You may be asked to monitor your blood pressure at home for 1 week or longer.  If you are diagnosed with hypertension, you may have other blood or imaging tests to help your health care provider understand your overall risk for other conditions. How is this treated? This condition is treated by making healthy lifestyle changes, such as eating healthy foods, exercising more, and reducing your alcohol intake. Your health care provider may prescribe medicine if lifestyle changes are not enough to get your blood pressure under control, and if:  Your systolic blood pressure is above 130.  Your diastolic blood pressure is above 80.  Your personal target blood pressure may vary depending on your medical conditions, your age, and other factors. Follow these instructions at home: Eating and drinking  Eat a  diet that is high in fiber and potassium, and low in sodium, added sugar, and fat. An example eating plan is called the DASH (Dietary Approaches to Stop Hypertension) diet. To eat this way: ? Eat plenty of fresh fruits and vegetables. Try to fill half of your plate at each meal with fruits and vegetables. ? Eat whole grains, such as whole wheat pasta, brown rice, or whole grain bread. Fill about one quarter of your plate with whole grains. ? Eat or drink low-fat dairy products, such as skim milk or low-fat yogurt. ? Avoid fatty cuts of meat, processed or cured meats, and poultry with skin. Fill about one quarter of your plate with lean proteins, such as fish, chicken without skin, beans, eggs, and tofu. ? Avoid premade and processed foods. These tend to be higher in sodium, added sugar, and fat.  Reduce your daily sodium intake. Most people with hypertension should eat less than 1,500 mg of sodium a day.  Limit alcohol intake to no more than 1 drink a day for nonpregnant women and 2 drinks a day for men. One drink equals 12 oz of beer, 5 oz of  wine, or 1 oz of hard liquor. Lifestyle  Work with your health care provider to maintain a healthy body weight or to lose weight. Ask what an ideal weight is for you.  Get at least 30 minutes of exercise that causes your heart to beat faster (aerobic exercise) most days of the week. Activities may include walking, swimming, or biking.  Include exercise to strengthen your muscles (resistance exercise), such as pilates or lifting weights, as part of your weekly exercise routine. Try to do these types of exercises for 30 minutes at least 3 days a week.  Do not use any products that contain nicotine or tobacco, such as cigarettes and e-cigarettes. If you need help quitting, ask your health care provider.  Monitor your blood pressure at home as told by your health care provider.  Keep all follow-up visits as told by your health care provider. This is  important. Medicines  Take over-the-counter and prescription medicines only as told by your health care provider. Follow directions carefully. Blood pressure medicines must be taken as prescribed.  Do not skip doses of blood pressure medicine. Doing this puts you at risk for problems and can make the medicine less effective.  Ask your health care provider about side effects or reactions to medicines that you should watch for. Contact a health care provider if:  You think you are having a reaction to a medicine you are taking.  You have headaches that keep coming back (recurring).  You feel dizzy.  You have swelling in your ankles.  You have trouble with your vision. Get help right away if:  You develop a severe headache or confusion.  You have unusual weakness or numbness.  You feel faint.  You have severe pain in your chest or abdomen.  You vomit repeatedly.  You have trouble breathing. Summary  Hypertension is when the force of blood pumping through your arteries is too strong. If this condition is not controlled, it may put you at risk for serious complications.  Your personal target blood pressure may vary depending on your medical conditions, your age, and other factors. For most people, a normal blood pressure is less than 120/80.  Hypertension is treated with lifestyle changes, medicines, or a combination of both. Lifestyle changes include weight loss, eating a healthy, low-sodium diet, exercising more, and limiting alcohol. This information is not intended to replace advice given to you by your health care provider. Make sure you discuss any questions you have with your health care provider. Document Released: 01/27/2005 Document Revised: 12/26/2015 Document Reviewed: 12/26/2015 Elsevier Interactive Patient Education  2018 [employer].     IF you received an x-ray today, you will receive an invoice from [facility]. Please contact [facility]  at [phone] with questions or concerns regarding your invoice.   IF you received labwork today, you will receive an invoice from [place]. Please contact [facility] at [phone] with questions or concerns regarding your invoice.   Our billing staff will not be able to assist you with questions regarding bills from these companies.  You will be contacted with the lab results as soon as they are available. The fastest way to get your results is to activate your My Chart account. Instructions are located on the last page of this paperwork. If you have not heard from [place] regarding the results in 2 weeks, please contact this office.

## 2017-08-26 LAB — LIPID PANEL
CHOL/HDL RATIO: 4.3 ratio (ref 0.0–4.4)
Cholesterol, Total: 198 mg/dL (ref 100–199)
HDL: 46 mg/dL (ref >39)
LDL Calculated: 123 mg/dL — ABNORMAL HIGH (ref 0–99)
TRIGLYCERIDES: 145 mg/dL (ref 0–149)
VLDL Cholesterol Cal: 29 mg/dL (ref 5–40)

## 2017-08-26 LAB — COMPREHENSIVE METABOLIC PANEL
A/G RATIO: 1.4 (ref 1.2–2.2)
ALT: 12 IU/L (ref 0–32)
AST: 17 IU/L (ref 0–40)
Albumin: 4.3 g/dL (ref 3.5–5.5)
Alkaline Phosphatase: 72 IU/L (ref 39–117)
BUN / CREAT RATIO: 15 (ref 9–23)
BUN: 13 mg/dL (ref 6–20)
Bilirubin Total: 0.2 mg/dL (ref 0.0–1.2)
CALCIUM: 9.2 mg/dL (ref 8.7–10.2)
CO2: 24 mmol/L (ref 20–29)
Chloride: 97 mmol/L (ref 96–106)
Creatinine, Ser: 0.87 mg/dL (ref 0.57–1.00)
GFR, EST AFRICAN AMERICAN: 104 mL/min/{1.73_m2} (ref >59)
GFR, EST NON AFRICAN AMERICAN: 90 mL/min/{1.73_m2} (ref >59)
GLOBULIN, TOTAL: 3.1 g/dL (ref 1.5–4.5)
Glucose: 146 mg/dL — ABNORMAL HIGH (ref 65–99)
POTASSIUM: 4 mmol/L (ref 3.5–5.2)
SODIUM: 140 mmol/L (ref 134–144)
Total Protein: 7.4 g/dL (ref 6.0–8.5)

## 2017-08-26 LAB — MICROALBUMIN / CREATININE URINE RATIO
Creatinine, Urine: 104.6 mg/dL
Microalbumin, Urine: <3 ug/mL

## 2017-10-15 ENCOUNTER — Telehealth: Payer: Self-pay | Admitting: Physician Assistant

## 2017-10-15 NOTE — Telephone Encounter (Signed)
Called pt. to reschedule their appt. with PA mani. Rescheduled with pt. °

## 2017-12-14 ENCOUNTER — Ambulatory Visit: Payer: 59 | Admitting: Obstetrics & Gynecology

## 2017-12-14 ENCOUNTER — Encounter: Payer: Self-pay | Admitting: Obstetrics & Gynecology

## 2017-12-14 VITALS — BP 128/86 | Ht 60.0 in | Wt 147.0 lb

## 2017-12-14 DIAGNOSIS — Z3169 Encounter for other general counseling and advice on procreation: Secondary | ICD-10-CM | POA: Diagnosis not present

## 2017-12-14 DIAGNOSIS — Z01419 Encounter for gynecological examination (general) (routine) without abnormal findings: Secondary | ICD-10-CM

## 2017-12-14 NOTE — Progress Notes (Signed)
Cheryl Hansen 06-Mar-1990 [id number]   History:    27 y.o. G0 Stable boyfriend x 9 yrs.  RP:  Established patient presenting for annual gyn exam   HPI: Menstrual periods every month with normal flow.  No BTB.  No pelvic pain.  Attempting conception.  LH urine positive.  Not always able to have timed IC.  On PNVs.  Improved her nutrition, exercising regularly.  BMI 28.71.  Breasts normal.  BMs and Urine normal.  Past medical history,surgical history, family history and social history were all reviewed and documented in the EPIC chart.  Gynecologic History Patient's last menstrual period was 12/08/2017. Contraception: none Last Pap: 11/2016. Results were: Negative Last mammogram: Never Bone Density: Never Colonoscopy: Never  Obstetric History OB History  Gravida Para Term Preterm AB Living  0 0 0 0 0 0  SAB TAB Ectopic Multiple Live Births  0 0 0 0 0     ROS: A ROS was performed and pertinent positives and negatives are included in the history.  GENERAL: No fevers or chills. HEENT: No change in vision, no earache, sore throat or sinus congestion. NECK: No pain or stiffness. CARDIOVASCULAR: No chest pain or pressure. No palpitations. PULMONARY: No shortness of breath, cough or wheeze. GASTROINTESTINAL: No abdominal pain, nausea, vomiting or diarrhea, melena or bright red blood per rectum. GENITOURINARY: No urinary frequency, urgency, hesitancy or dysuria. MUSCULOSKELETAL: No joint or muscle pain, no back pain, no recent trauma. DERMATOLOGIC: No rash, no itching, no lesions. ENDOCRINE: No polyuria, polydipsia, no heat or cold intolerance. No recent change in weight. HEMATOLOGICAL: No anemia or easy bruising or bleeding. NEUROLOGIC: No headache, seizures, numbness, tingling or weakness. PSYCHIATRIC: No depression, no loss of interest in normal activity or change in sleep pattern.     Exam:   BP 128/86   Ht 5' (1.524 m)   Wt 147 lb (66.7 kg)   LMP 12/08/2017 Comment: no birth  control   BMI 28.71 kg/m   Body mass index is 28.71 kg/m.  General appearance : Well developed well nourished female. No acute distress HEENT: Eyes: no retinal hemorrhage or exudates,  Neck supple, trachea midline, no carotid bruits, no thyroidmegaly Lungs: Clear to auscultation, no rhonchi or wheezes, or rib retractions  Heart: Regular rate and rhythm, no murmurs or gallops Breast:Examined in sitting and supine position were symmetrical in appearance, no palpable masses or tenderness,  no skin retraction, no nipple inversion, no nipple discharge, no skin discoloration, no axillary or supraclavicular lymphadenopathy Abdomen: no palpable masses or tenderness, no rebound or guarding Extremities: no edema or skin discoloration or tenderness  Pelvic: Vulva: Normal             Vagina: No gross lesions or discharge  Cervix: No gross lesions or discharge.  Pap reflex done.  Uterus  AV, normal size, shape and consistency, non-tender and mobile  Adnexa  Without masses or tenderness  Anus: Normal   Assessment/Plan:  27 y.o. female for annual exam   1. Encounter for routine gynecological examination with Papanicolaou smear of cervix Normal gynecologic exam.  Pap reflex done today.  Breast exam normal.  Body mass index 28.71.  Recommend a slightly lower calorie nutrition and I aerobic physical activity 5 times a week with weightlifting every 2 days.  2. Encounter for preconception consultation Attempting conception.  Has regular cycles with positive LH.  Not always able to have timed intercourse.  On prenatal vitamins.  Continue with healthy nutrition and regular physical activity.  [name] MD, 2:09 PM 12/14/2017

## 2017-12-15 LAB — PAP IG W/ RFLX HPV ASCU

## 2017-12-21 NOTE — Patient Instructions (Signed)
1. Encounter for routine gynecological examination with Papanicolaou smear of cervix Normal gynecologic exam.  Pap reflex done today.  Breast exam normal.  Body mass index 28.71.  Recommend a slightly lower calorie nutrition and I aerobic physical activity 5 times a week with weightlifting every 2 days.  2. Encounter for preconception consultation Attempting conception.  Has regular cycles with positive LH.  Not always able to have timed intercourse.  On prenatal vitamins.  Continue with healthy nutrition and regular physical activity.  Cheryl Hansen, it was a pleasure seeing you today!  I will inform you of your results as soon as they are available.

## 2017-12-24 ENCOUNTER — Telehealth: Payer: Self-pay | Admitting: Physician Assistant

## 2017-12-24 NOTE — Telephone Encounter (Signed)
LVM for pt to call the office and reschedule their appt on 02/24/18 with McVey. We will need to get pt rescheduled with either Dr. Creta LevinStallings, Dr. Leretha PolSantiago or Dr. Alvy BimlerSagardia.  When pt calls back, please schedule for an OV - Establish Care - McVey pt - for hypertension Thank you!

## 2018-02-24 ENCOUNTER — Telehealth: Payer: Self-pay | Admitting: *Deleted

## 2018-02-24 NOTE — Telephone Encounter (Signed)
Dr.Lavoie patient) patient called c/o heavy cycle, LMP:02/21/18 wearing tampons/pad changing every 2-3 hours with daily clots. Her cycles are normally 5 days long, not as heavy bleeding, Dec cycle started on 01/25/18 lasted until 02/04/18 took clomid with dec cycle, has PCOS as well. She wanted to know if she monitor bleeding/clots? If Rx could be sent to slow down bleeding/clots? Wait and follow up and wait until Dr.Lavoie returns? She would like any recommendations. Please advise

## 2018-02-24 NOTE — Telephone Encounter (Signed)
Telephone call, no vertigo, reviewed as long as not needing to change every hour body can compensate if cycle last more than 7 days instructed to call, December cycle 10 days but did take Clomid with that cycle.  Continue prenatal vitamin daily.  Reviewed importance of increasing frequency of intercourse.

## 2018-02-26 ENCOUNTER — Ambulatory Visit: Payer: 59 | Admitting: Urgent Care

## 2018-02-26 ENCOUNTER — Ambulatory Visit: Payer: 59 | Admitting: Physician Assistant

## 2018-12-16 ENCOUNTER — Encounter: Payer: 59 | Admitting: Obstetrics & Gynecology

## 2018-12-31 ENCOUNTER — Other Ambulatory Visit: Payer: Self-pay

## 2018-12-31 DIAGNOSIS — Z20822 Contact with and (suspected) exposure to covid-19: Secondary | ICD-10-CM

## 2019-01-02 LAB — NOVEL CORONAVIRUS, NAA: SARS-CoV-2, NAA: NOT DETECTED

## 2019-03-08 ENCOUNTER — Ambulatory Visit: Payer: Self-pay

## 2019-03-08 ENCOUNTER — Ambulatory Visit: Payer: Medicaid Other | Attending: Internal Medicine

## 2019-03-08 DIAGNOSIS — Z20822 Contact with and (suspected) exposure to covid-19: Secondary | ICD-10-CM | POA: Insufficient documentation

## 2019-03-09 LAB — NOVEL CORONAVIRUS, NAA: SARS-CoV-2, NAA: NOT DETECTED

## 2019-04-08 ENCOUNTER — Ambulatory Visit: Payer: Medicaid Other | Attending: Internal Medicine

## 2019-04-08 DIAGNOSIS — Z20822 Contact with and (suspected) exposure to covid-19: Secondary | ICD-10-CM

## 2019-04-09 LAB — NOVEL CORONAVIRUS, NAA: SARS-CoV-2, NAA: NOT DETECTED

## 2019-04-20 ENCOUNTER — Other Ambulatory Visit: Payer: Medicaid Other

## 2019-07-27 ENCOUNTER — Other Ambulatory Visit: Payer: Self-pay

## 2019-07-27 ENCOUNTER — Encounter: Payer: Self-pay | Admitting: Registered Nurse

## 2019-07-27 ENCOUNTER — Ambulatory Visit (INDEPENDENT_AMBULATORY_CARE_PROVIDER_SITE_OTHER): Payer: Self-pay | Admitting: Registered Nurse

## 2019-07-27 VITALS — BP 163/113 | HR 82 | Temp 98.1°F | Ht 60.0 in | Wt 127.4 lb

## 2019-07-27 DIAGNOSIS — B379 Candidiasis, unspecified: Secondary | ICD-10-CM

## 2019-07-27 DIAGNOSIS — I10 Essential (primary) hypertension: Secondary | ICD-10-CM

## 2019-07-27 LAB — POCT URINALYSIS DIP (MANUAL ENTRY)
Bilirubin, UA: NEGATIVE
Blood, UA: NEGATIVE
Glucose, UA: NEGATIVE mg/dL
Ketones, POC UA: NEGATIVE mg/dL
Nitrite, UA: NEGATIVE
Protein Ur, POC: NEGATIVE mg/dL
Spec Grav, UA: 1.02 (ref 1.010–1.025)
Urobilinogen, UA: 0.2 E.U./dL
pH, UA: 6 (ref 5.0–8.0)

## 2019-07-27 LAB — POCT WET + KOH PREP: Trich by wet prep: ABSENT

## 2019-07-27 MED ORDER — FLUCONAZOLE 150 MG PO TABS: 150.0000 mg | ORAL_TABLET | Freq: Once | ORAL | 0 refills | Status: AC

## 2019-07-27 MED ORDER — AMLODIPINE BESYLATE 10 MG PO TABS: 10.0000 mg | ORAL_TABLET | Freq: Every day | ORAL | 3 refills | Status: AC

## 2019-07-27 NOTE — Progress Notes (Signed)
Established Patient Office Visit  Subjective:  Patient ID: Cheryl Hansen, female    DOB: 02-10-1991  Age: 29 y.o. MRN: [id number]  CC:  Chief Complaint  Patient presents with  . Vaginitis    Pt stated that for the past 4 days she has been experiencing itching and sensetative to the touch in her vaginal area. Ther is no burning with urination.    HPI Cheryl Hansen presents for vaginitis. Onset 4 days ago. Itching, irritation. No urinary symptoms. No discharge. No concern for sti exposure. No pelvic pain or flank pain, no systemic symptoms  Notes that she has been on amlodipine for bp in the past - bp very elevated today, not symptomatic. Will restart on amlodpine 10mg  PO qd and return in 2 weeks for [facility] nurse visit bp check  No further concerns today.   Past Medical History:  Diagnosis Date  . Asthma   . Colitis   . Hypertension     No past surgical history on file.  Family History  Problem Relation Age of Onset  . Hypertension Maternal Grandmother   . Hypertension Paternal Grandfather   . Hyperlipidemia Mother   . Cancer Paternal Grandmother 30       colon  . Colitis Paternal Grandmother   . Hypertension Paternal Grandmother   . Hypertension Father     Social History   Socioeconomic History  . Marital status: Single    Spouse name: Not on file  . Number of children: Not on file  . Years of education: Not on file  . Highest education level: Not on file  Occupational History  . Not on file  Tobacco Use  . Smoking status: Former 42  . Smokeless tobacco: Never Used  Vaping Use  . Vaping Use: Never used  Substance and Sexual Activity  . Alcohol use: No  . Drug use: No  . Sexual activity: Yes    Partners: Male    Comment: 1ST INTERCOURSE- 67, PARTNERS- 3, CURRENT PARTNER- 8 YRS   Other Topics Concern  . Not on file  Social History Narrative  . Not on file   Social Determinants of Health   Financial Resource Strain:   . Difficulty of Paying  Living Expenses:   Food Insecurity:   . Worried About 12 in the Last Year:   . Programme researcher, broadcasting/film/video in the Last Year:   Transportation Needs:   . Barista (Medical):   Freight forwarder Lack of Transportation (Non-Medical):   Physical Activity:   . Days of Exercise per Week:   . Minutes of Exercise per Session:   Stress:   . Feeling of Stress :   Social Connections:   . Frequency of Communication with Friends and Family:   . Frequency of Social Gatherings with Friends and Family:   . Attends Religious Services:   . Active Member of Clubs or Organizations:   . Attends Marland Kitchen Meetings:   Banker Marital Status:   Intimate Partner Violence:   . Fear of Current or Ex-Partner:   . Emotionally Abused:   Marland Kitchen Physically Abused:   . Sexually Abused:     Outpatient Medications Prior to Visit  Medication Sig Dispense Refill  . Multiple Vitamin (MULTIVITAMIN) tablet Take 1 tablet by mouth daily.    . clomiPHENE (CLOMID) 50 MG tablet Take 1 tablet (50 mg total) by mouth daily. Take 1 tab daily x 5 days starting on day 3 of the next period. (  Patient not taking: Reported on 12/14/2017) 5 tablet 0  . medroxyPROGESTERone (PROVERA) 5 MG tablet Take 1 tablet (5 mg total) by mouth daily for 7 days. Start in 2 weeks after a negative HPT. 7 tablet 0  . amLODipine (NORVASC) 5 MG tablet Take 1 tablet (5 mg total) by mouth daily. (Patient not taking: Reported on 07/27/2019) 90 tablet 3   No facility-administered medications prior to visit.    No Known Allergies  ROS Review of Systems Per hpi    Objective:    Physical Exam Vitals and nursing note reviewed.  Constitutional:      General: She is not in acute distress.    Appearance: Normal appearance. She is normal weight. She is not ill-appearing, toxic-appearing or diaphoretic.  Cardiovascular:     Rate and Rhythm: Normal rate and regular rhythm.     Heart sounds: Normal heart sounds.  Pulmonary:     Effort: Pulmonary effort  is normal. No respiratory distress.     Breath sounds: Normal breath sounds.  Genitourinary:    Comments: Pt deferred exam, self swab collected. Neurological:     General: No focal deficit present.     Mental Status: She is alert and oriented to person, place, and time. Mental status is at baseline.  Psychiatric:        Mood and Affect: Mood normal.        Behavior: Behavior normal.        Thought Content: Thought content normal.        Judgment: Judgment normal.     BP (!) 163/113 (BP Location: Right Arm, Patient Position: Sitting, Cuff Size: Normal)   Pulse 82   Temp 98.1 F (36.7 C) (Temporal)   Ht 5' (1.524 m)   Wt 127 lb 6.4 oz (57.8 kg)   LMP 07/02/2019   SpO2 95%   BMI 24.88 kg/m  Wt Readings from Last 3 Encounters:  07/27/19 127 lb 6.4 oz (57.8 kg)  12/14/17 147 lb (66.7 kg)  08/25/17 154 lb (69.9 kg)     Health Maintenance Due  Topic Date Due  . COVID-19 Vaccine (1) Never done    There are no preventive care reminders to display for this patient.  Lab Results  Component Value Date   TSH 0.91 12/11/2016   Lab Results  Component Value Date   WBC 3.8 11/26/2016   HGB 12.6 11/26/2016   HCT 40.2 11/26/2016   MCV 85 11/26/2016   PLT 255 11/26/2016   Lab Results  Component Value Date   NA 140 08/25/2017   K 4.0 08/25/2017   CO2 24 08/25/2017   GLUCOSE 146 (H) 08/25/2017   BUN 13 08/25/2017   CREATININE 0.87 08/25/2017   BILITOT 0.2 08/25/2017   ALKPHOS 72 08/25/2017   AST 17 08/25/2017   ALT 12 08/25/2017   PROT 7.4 08/25/2017   ALBUMIN 4.3 08/25/2017   CALCIUM 9.2 08/25/2017   GFR 102.66 01/16/2016   Lab Results  Component Value Date   CHOL 198 08/25/2017   Lab Results  Component Value Date   HDL 46 08/25/2017   Lab Results  Component Value Date   LDLCALC 123 (H) 08/25/2017   Lab Results  Component Value Date   TRIG 145 08/25/2017   Lab Results  Component Value Date   CHOLHDL 4.3 08/25/2017   Lab Results  Component Value Date    HGBA1C 5.5 12/11/2016      Assessment & Plan:   Problem List Items Addressed  This Visit    None    Visit Diagnoses    Yeast infection    -  Primary   Relevant Orders   POCT Wet + KOH Prep   POCT urinalysis dipstick   Essential hypertension       Relevant Medications   amLODipine (NORVASC) 10 MG tablet      Meds ordered this encounter  Medications  . amLODipine (NORVASC) 10 MG tablet    Sig: Take 1 tablet (10 mg total) by mouth daily.    Dispense:  90 tablet    Refill:  3    Order Specific Question:   Supervising Provider    Answer:   Neva Seat, JEFFREY R [[id number]]    Follow-up: No follow-ups on file.   PLAN  poct wet prep shows candidal infection. Will send diflucan  Start amlodipine 10mg  PO qd, return in 2 weeks for bp check  Patient encouraged to call clinic with any questions, comments, or concerns.  , NP

## 2019-07-27 NOTE — Patient Instructions (Signed)
° ° ° °  If you have lab work done today you will be contacted with your lab results within the next 2 weeks.  If you have not heard from us then please contact us. The fastest way to get your results is to register for My Chart. ° ° °IF you received an x-ray today, you will receive an invoice from [facility]. Please contact [facility] at [phone] with questions or concerns regarding your invoice.  ° °IF you received labwork today, you will receive an invoice from [facility]. Please contact [facility] at [phone] with questions or concerns regarding your invoice.  ° °Our billing staff will not be able to assist you with questions regarding bills from these companies. ° °You will be contacted with the lab results as soon as they are available. The fastest way to get your results is to activate your My Chart account. Instructions are located on the last page of this paperwork. If you have not heard from us regarding the results in 2 weeks, please contact this office. °  ° ° ° °

## 2019-08-10 ENCOUNTER — Ambulatory Visit (INDEPENDENT_AMBULATORY_CARE_PROVIDER_SITE_OTHER): Payer: Self-pay | Admitting: Registered Nurse

## 2019-08-10 ENCOUNTER — Encounter: Payer: Self-pay | Admitting: Registered Nurse

## 2019-08-10 ENCOUNTER — Other Ambulatory Visit: Payer: Self-pay

## 2019-08-10 VITALS — BP 134/87 | HR 60 | Temp 98.1°F | Resp 18 | Ht 60.0 in | Wt 125.2 lb

## 2019-08-10 DIAGNOSIS — I10 Essential (primary) hypertension: Secondary | ICD-10-CM

## 2019-08-10 NOTE — Patient Instructions (Signed)
° ° ° °  If you have lab work done today you will be contacted with your lab results within the next 2 weeks.  If you have not heard from us then please contact us. The fastest way to get your results is to register for My Chart. ° ° °IF you received an x-ray today, you will receive an invoice from [facility]. Please contact [facility] at [phone] with questions or concerns regarding your invoice.  ° °IF you received labwork today, you will receive an invoice from [facility]. Please contact [facility] at [phone] with questions or concerns regarding your invoice.  ° °Our billing staff will not be able to assist you with questions regarding bills from these companies. ° °You will be contacted with the lab results as soon as they are available. The fastest way to get your results is to activate your My Chart account. Instructions are located on the last page of this paperwork. If you have not heard from us regarding the results in 2 weeks, please contact this office. °  ° ° ° °

## 2019-08-10 NOTE — Progress Notes (Signed)
Established Patient Office Visit  Subjective:  Patient ID: Cheryl Hansen, female    DOB: 19-Nov-1990  Age: 29 y.o. MRN: [id number]  CC:  Chief Complaint  Patient presents with  . Follow-up    2 week follow up on hypertension . Patient would like to discuss how she take her medication    HPI Cheryl Hansen presents for 2 week follow up on htn  bp wnl Feeling well Tolerating amlodipine very well No complaints  Past Medical History:  Diagnosis Date  . Asthma   . Colitis   . Hypertension     No past surgical history on file.  Family History  Problem Relation Age of Onset  . Hypertension Maternal Grandmother   . Hypertension Paternal Grandfather   . Hyperlipidemia Mother   . Cancer Paternal Grandmother 50       colon  . Colitis Paternal Grandmother   . Hypertension Paternal Grandmother   . Hypertension Father     Social History   Socioeconomic History  . Marital status: Single    Spouse name: Not on file  . Number of children: Not on file  . Years of education: Not on file  . Highest education level: Not on file  Occupational History  . Not on file  Tobacco Use  . Smoking status: Former Games developer  . Smokeless tobacco: Never Used  Vaping Use  . Vaping Use: Never used  Substance and Sexual Activity  . Alcohol use: No  . Drug use: No  . Sexual activity: Yes    Partners: Male    Comment: 1ST INTERCOURSE- 18, PARTNERS- 3, CURRENT PARTNER- 8 YRS   Other Topics Concern  . Not on file  Social History Narrative  . Not on file   Social Determinants of Health   Financial Resource Strain:   . Difficulty of Paying Living Expenses:   Food Insecurity:   . Worried About Programme researcher, broadcasting/film/video in the Last Year:   . Barista in the Last Year:   Transportation Needs:   . Freight forwarder (Medical):   Marland Kitchen Lack of Transportation (Non-Medical):   Physical Activity:   . Days of Exercise per Week:   . Minutes of Exercise per Session:   Stress:   . Feeling  of Stress :   Social Connections:   . Frequency of Communication with Friends and Family:   . Frequency of Social Gatherings with Friends and Family:   . Attends Religious Services:   . Active Member of Clubs or Organizations:   . Attends Banker Meetings:   Marland Kitchen Marital Status:   Intimate Partner Violence:   . Fear of Current or Ex-Partner:   . Emotionally Abused:   Marland Kitchen Physically Abused:   . Sexually Abused:     Outpatient Medications Prior to Visit  Medication Sig Dispense Refill  . amLODipine (NORVASC) 10 MG tablet Take 1 tablet (10 mg total) by mouth daily. 90 tablet 3  . Multiple Vitamin (MULTIVITAMIN) tablet Take 1 tablet by mouth daily.    . clomiPHENE (CLOMID) 50 MG tablet Take 1 tablet (50 mg total) by mouth daily. Take 1 tab daily x 5 days starting on day 3 of the next period. (Patient not taking: Reported on 12/14/2017) 5 tablet 0  . medroxyPROGESTERone (PROVERA) 5 MG tablet Take 1 tablet (5 mg total) by mouth daily for 7 days. Start in 2 weeks after a negative HPT. 7 tablet 0   No facility-administered medications prior  to visit.    No Known Allergies  ROS Review of Systems Per hpi. Otherwise negative on 10pt ros   Objective:    Physical Exam Vitals and nursing note reviewed.  Constitutional:      General: She is not in acute distress.    Appearance: Normal appearance. She is normal weight. She is not ill-appearing, toxic-appearing or diaphoretic.  Cardiovascular:     Rate and Rhythm: Normal rate and regular rhythm.  Pulmonary:     Effort: Pulmonary effort is normal. No respiratory distress.  Skin:    General: Skin is warm and dry.  Neurological:     General: No focal deficit present.     Mental Status: She is alert. Mental status is at baseline.  Psychiatric:        Mood and Affect: Mood normal.        Behavior: Behavior normal.        Thought Content: Thought content normal.        Judgment: Judgment normal.     BP 134/87   Pulse 60    Temp 98.1 F (36.7 C) (Temporal)   Resp 18   Ht 5' (1.524 m)   Wt 125 lb 3.2 oz (56.8 kg)   SpO2 95%   BMI 24.45 kg/m  Wt Readings from Last 3 Encounters:  08/10/19 125 lb 3.2 oz (56.8 kg)  07/27/19 127 lb 6.4 oz (57.8 kg)  12/14/17 147 lb (66.7 kg)     There are no preventive care reminders to display for this patient.  There are no preventive care reminders to display for this patient.  Lab Results  Component Value Date   TSH 0.91 12/11/2016   Lab Results  Component Value Date   WBC 3.8 11/26/2016   HGB 12.6 11/26/2016   HCT 40.2 11/26/2016   MCV 85 11/26/2016   PLT 255 11/26/2016   Lab Results  Component Value Date   NA 140 08/25/2017   K 4.0 08/25/2017   CO2 24 08/25/2017   GLUCOSE 146 (H) 08/25/2017   BUN 13 08/25/2017   CREATININE 0.87 08/25/2017   BILITOT 0.2 08/25/2017   ALKPHOS 72 08/25/2017   AST 17 08/25/2017   ALT 12 08/25/2017   PROT 7.4 08/25/2017   ALBUMIN 4.3 08/25/2017   CALCIUM 9.2 08/25/2017   GFR 102.66 01/16/2016   Lab Results  Component Value Date   CHOL 198 08/25/2017   Lab Results  Component Value Date   HDL 46 08/25/2017   Lab Results  Component Value Date   LDLCALC 123 (H) 08/25/2017   Lab Results  Component Value Date   TRIG 145 08/25/2017   Lab Results  Component Value Date   CHOLHDL 4.3 08/25/2017   Lab Results  Component Value Date   HGBA1C 5.5 12/11/2016      Assessment & Plan:   Problem List Items Addressed This Visit    None    Visit Diagnoses    Essential hypertension    -  Primary      No orders of the defined types were placed in this encounter.   Follow-up: No follow-ups on file.   PLAN  Continue amlodipine 10mg  PO qd  Return in 1 year for med check, sooner with complaint  Patient encouraged to call clinic with any questions, comments, or concerns.  , NP

## 2019-08-30 ENCOUNTER — Ambulatory Visit (INDEPENDENT_AMBULATORY_CARE_PROVIDER_SITE_OTHER): Payer: Self-pay | Admitting: Registered Nurse

## 2019-08-30 ENCOUNTER — Encounter: Payer: Self-pay | Admitting: Registered Nurse

## 2019-08-30 ENCOUNTER — Other Ambulatory Visit: Payer: Self-pay

## 2019-08-30 VITALS — BP 111/71 | HR 67 | Temp 98.0°F | Resp 18 | Ht 60.0 in | Wt 127.0 lb

## 2019-08-30 DIAGNOSIS — L709 Acne, unspecified: Secondary | ICD-10-CM

## 2019-08-30 MED ORDER — CLINDAMYCIN PHOS-BENZOYL PEROX 1-5 % EX GEL: Freq: Two times a day (BID) | CUTANEOUS | 3 refills | Status: DC

## 2019-08-30 NOTE — Progress Notes (Signed)
Established Patient Office Visit  Subjective:  Patient ID: Cheryl Hansen, female    DOB: 1990-06-04  Age: 29 y.o. MRN: [id number]  CC:  Chief Complaint  Patient presents with  . Acne    Patient states she has noticed in the last three weeks she has been noticing her face breaking out bad. Per patient she has been using Witchhazel for her face now but still breaking out    HPI Cheryl Hansen presents for acne  Onset around 2-3 weeks ago. Breaking out mostly on cheeks. No redness, swelling, sloughing. Breakout seems to be comedones. Has not squeezed / popped any. Has been using witch hazel with some relief - but break outs continue appearing Admits to poor diet recently  No new products, soaps, exposures to her knowledge No one else at home has this  Of note - started a new job around the time this started, had been going without a mask for much of the pandemic now is wearing masks for the duration of 12 h shifts.  Past Medical History:  Diagnosis Date  . Asthma   . Colitis   . Hypertension     No past surgical history on file.  Family History  Problem Relation Age of Onset  . Hypertension Maternal Grandmother   . Hypertension Paternal Grandfather   . Hyperlipidemia Mother   . Cancer Paternal Grandmother 64       colon  . Colitis Paternal Grandmother   . Hypertension Paternal Grandmother   . Hypertension Father     Social History   Socioeconomic History  . Marital status: Single    Spouse name: Not on file  . Number of children: Not on file  . Years of education: Not on file  . Highest education level: Not on file  Occupational History  . Not on file  Tobacco Use  . Smoking status: Former Games developer  . Smokeless tobacco: Never Used  Vaping Use  . Vaping Use: Never used  Substance and Sexual Activity  . Alcohol use: No  . Drug use: No  . Sexual activity: Yes    Partners: Male    Comment: 1ST INTERCOURSE- 58, PARTNERS- 5, CURRENT PARTNER- 8 YRS   Other  Topics Concern  . Not on file  Social History Narrative  . Not on file   Social Determinants of Health   Financial Resource Strain:   . Difficulty of Paying Living Expenses:   Food Insecurity:   . Worried About Programme researcher, broadcasting/film/video in the Last Year:   . Barista in the Last Year:   Transportation Needs:   . Freight forwarder (Medical):   Marland Kitchen Lack of Transportation (Non-Medical):   Physical Activity:   . Days of Exercise per Week:   . Minutes of Exercise per Session:   Stress:   . Feeling of Stress :   Social Connections:   . Frequency of Communication with Friends and Family:   . Frequency of Social Gatherings with Friends and Family:   . Attends Religious Services:   . Active Member of Clubs or Organizations:   . Attends Banker Meetings:   Marland Kitchen Marital Status:   Intimate Partner Violence:   . Fear of Current or Ex-Partner:   . Emotionally Abused:   Marland Kitchen Physically Abused:   . Sexually Abused:     Outpatient Medications Prior to Visit  Medication Sig Dispense Refill  . amLODipine (NORVASC) 10 MG tablet Take 1 tablet (10 mg  total) by mouth daily. 90 tablet 3  . Multiple Vitamin (MULTIVITAMIN) tablet Take 1 tablet by mouth daily.    . clomiPHENE (CLOMID) 50 MG tablet Take 1 tablet (50 mg total) by mouth daily. Take 1 tab daily x 5 days starting on day 3 of the next period. (Patient not taking: Reported on 12/14/2017) 5 tablet 0  . medroxyPROGESTERone (PROVERA) 5 MG tablet Take 1 tablet (5 mg total) by mouth daily for 7 days. Start in 2 weeks after a negative HPT. 7 tablet 0   No facility-administered medications prior to visit.    No Known Allergies  ROS Review of Systems  Constitutional: Negative.   HENT: Negative.   Eyes: Negative.   Respiratory: Negative.   Cardiovascular: Negative.   Gastrointestinal: Negative.   Endocrine: Negative.   Genitourinary: Negative.   Musculoskeletal: Negative.   Skin: Positive for rash. Negative for color change,  pallor and wound.  Allergic/Immunologic: Negative.   Neurological: Negative.   Hematological: Negative.   Psychiatric/Behavioral: Negative.   All other systems reviewed and are negative.     Objective:    Physical Exam Vitals and nursing note reviewed.  Constitutional:      General: She is not in acute distress.    Appearance: Normal appearance. She is normal weight. She is not ill-appearing, toxic-appearing or diaphoretic.  Cardiovascular:     Rate and Rhythm: Normal rate and regular rhythm.  Pulmonary:     Effort: Pulmonary effort is normal. No respiratory distress.  Skin:    General: Skin is warm and dry.     Coloration: Skin is not jaundiced or pale.     Findings: Lesion (acne, both cheeks. no erythema or swelling on face.) present. No bruising, erythema or rash.  Neurological:     General: No focal deficit present.     Mental Status: She is alert and oriented to person, place, and time. Mental status is at baseline.  Psychiatric:        Mood and Affect: Mood normal.        Behavior: Behavior normal.        Thought Content: Thought content normal.        Judgment: Judgment normal.     BP 111/71   Pulse 67   Temp 98 F (36.7 C) (Temporal)   Resp 18   Ht 5' (1.524 m)   Wt 127 lb (57.6 kg)   SpO2 98%   BMI 24.80 kg/m  Wt Readings from Last 3 Encounters:  08/30/19 127 lb (57.6 kg)  08/10/19 125 lb 3.2 oz (56.8 kg)  07/27/19 127 lb 6.4 oz (57.8 kg)     There are no preventive care reminders to display for this patient.  There are no preventive care reminders to display for this patient.  Lab Results  Component Value Date   TSH 0.91 12/11/2016   Lab Results  Component Value Date   WBC 3.8 11/26/2016   HGB 12.6 11/26/2016   HCT 40.2 11/26/2016   MCV 85 11/26/2016   PLT 255 11/26/2016   Lab Results  Component Value Date   NA 140 08/25/2017   K 4.0 08/25/2017   CO2 24 08/25/2017   GLUCOSE 146 (H) 08/25/2017   BUN 13 08/25/2017   CREATININE 0.87  08/25/2017   BILITOT 0.2 08/25/2017   ALKPHOS 72 08/25/2017   AST 17 08/25/2017   ALT 12 08/25/2017   PROT 7.4 08/25/2017   ALBUMIN 4.3 08/25/2017   CALCIUM 9.2 08/25/2017  GFR 102.66 01/16/2016   Lab Results  Component Value Date   CHOL 198 08/25/2017   Lab Results  Component Value Date   HDL 46 08/25/2017   Lab Results  Component Value Date   LDLCALC 123 (H) 08/25/2017   Lab Results  Component Value Date   TRIG 145 08/25/2017   Lab Results  Component Value Date   CHOLHDL 4.3 08/25/2017   Lab Results  Component Value Date   HGBA1C 5.5 12/11/2016      Assessment & Plan:   Problem List Items Addressed This Visit    None    Visit Diagnoses    Acne, unspecified acne type    -  Primary   Relevant Medications   clindamycin-benzoyl peroxide (BENZACLIN) gel      Meds ordered this encounter  Medications  . clindamycin-benzoyl peroxide (BENZACLIN) gel    Sig: Apply topically 2 (two) times daily.    Dispense:  25 g    Refill:  3    Order Specific Question:   Supervising Provider    AnswerMyles Lipps [[id number]]    Follow-up: No follow-ups on file.   PLAN  Use benzaclin once to twice daily. Keep face clean and dry. Get sun exposure when possible. Improve diet. Use very basic cleaners otherwise  Return prn  Patient encouraged to call clinic with any questions, comments, or concerns.  Janeece Agee, NP

## 2019-08-30 NOTE — Patient Instructions (Signed)
° ° ° °  If you have lab work done today you will be contacted with your lab results within the next 2 weeks.  If you have not heard from us then please contact us. The fastest way to get your results is to register for My Chart. ° ° °IF you received an x-ray today, you will receive an invoice from [facility]. Please contact [facility] at [phone] with questions or concerns regarding your invoice.  ° °IF you received labwork today, you will receive an invoice from [facility]. Please contact [facility] at [phone] with questions or concerns regarding your invoice.  ° °Our billing staff will not be able to assist you with questions regarding bills from these companies. ° °You will be contacted with the lab results as soon as they are available. The fastest way to get your results is to activate your My Chart account. Instructions are located on the last page of this paperwork. If you have not heard from us regarding the results in 2 weeks, please contact this office. °  ° ° ° °

## 2020-02-08 ENCOUNTER — Telehealth (INDEPENDENT_AMBULATORY_CARE_PROVIDER_SITE_OTHER): Payer: 59 | Admitting: Family Medicine

## 2020-02-08 ENCOUNTER — Other Ambulatory Visit: Payer: Medicaid Other

## 2020-02-08 ENCOUNTER — Encounter: Payer: Self-pay | Admitting: Family Medicine

## 2020-02-08 ENCOUNTER — Other Ambulatory Visit: Payer: Self-pay

## 2020-02-08 DIAGNOSIS — Z7185 Encounter for immunization safety counseling: Secondary | ICD-10-CM | POA: Diagnosis not present

## 2020-02-08 DIAGNOSIS — Z20822 Contact with and (suspected) exposure to covid-19: Secondary | ICD-10-CM | POA: Diagnosis not present

## 2020-02-08 NOTE — Patient Instructions (Signed)
COVID-19 COVID-19 is a respiratory infection that is caused by a virus called severe acute respiratory syndrome coronavirus 2 (SARS-CoV-2). The disease is also known as coronavirus disease or novel coronavirus. In some people, the virus may not cause any symptoms. In others, it may cause a serious infection. The infection can get worse quickly and can lead to complications, such as:  Pneumonia, or infection of the lungs.  Acute respiratory distress syndrome or ARDS. This is a condition in which fluid build-up in the lungs prevents the lungs from filling with air and passing oxygen into the blood.  Acute respiratory failure. This is a condition in which there is not enough oxygen passing from the lungs to the body or when carbon dioxide is not passing from the lungs out of the body.  Sepsis or septic shock. This is a serious bodily reaction to an infection.  Blood clotting problems.  Secondary infections due to bacteria or fungus.  Organ failure. This is when your body's organs stop working. The virus that causes COVID-19 is contagious. This means that it can spread from person to person through droplets from coughs and sneezes (respiratory secretions). What are the causes? This illness is caused by a virus. You may catch the virus by:  Breathing in droplets from an infected person. Droplets can be spread by a person breathing, speaking, singing, coughing, or sneezing.  Touching something, like a table or a doorknob, that was exposed to the virus (contaminated) and then touching your mouth, nose, or eyes. What increases the risk? Risk for infection You are more likely to be infected with this virus if you:  Are within 6 feet (2 meters) of a person with COVID-19.  Provide care for or live with a person who is infected with COVID-19.  Spend time in crowded indoor spaces or live in shared housing. Risk for serious illness You are more likely to become seriously ill from the virus if you:   Are 50 years of age or older. The higher your age, the more you are at risk for serious illness.  Live in a nursing home or long-term care facility.  Have cancer.  Have a long-term (chronic) disease such as: ? Chronic lung disease, including chronic obstructive pulmonary disease or asthma. ? A long-term disease that lowers your body's ability to fight infection (immunocompromised). ? Heart disease, including heart failure, a condition in which the arteries that lead to the heart become narrow or blocked (coronary artery disease), a disease which makes the heart muscle thick, weak, or stiff (cardiomyopathy). ? Diabetes. ? Chronic kidney disease. ? Sickle cell disease, a condition in which red blood cells have an abnormal "sickle" shape. ? Liver disease.  Are obese. What are the signs or symptoms? Symptoms of this condition can range from mild to severe. Symptoms may appear any time from 2 to 14 days after being exposed to the virus. They include:  A fever or chills.  A cough.  Difficulty breathing.  Headaches, body aches, or muscle aches.  Runny or stuffy (congested) nose.  A sore throat.  New loss of taste or smell. Some people may also have stomach problems, such as nausea, vomiting, or diarrhea. Other people may not have any symptoms of COVID-19. How is this diagnosed? This condition may be diagnosed based on:  Your signs and symptoms, especially if: ? You live in an area with a COVID-19 outbreak. ? You recently traveled to or from an area where the virus is common. ? You   provide care for or live with a person who was diagnosed with COVID-19. ? You were exposed to a person who was diagnosed with COVID-19.  A physical exam.  Lab tests, which may include: ? Taking a sample of fluid from the back of your nose and throat (nasopharyngeal fluid), your nose, or your throat using a swab. ? A sample of mucus from your lungs (sputum). ? Blood tests.  Imaging tests, which  may include, X-rays, CT scan, or ultrasound. How is this treated? At present, there is no medicine to treat COVID-19. Medicines that treat other diseases are being used on a trial basis to see if they are effective against COVID-19. Your health care provider will talk with you about ways to treat your symptoms. For most people, the infection is mild and can be managed at home with rest, fluids, and over-the-counter medicines. Treatment for a serious infection usually takes places in a hospital intensive care unit (ICU). It may include one or more of the following treatments. These treatments are given until your symptoms improve.  Receiving fluids and medicines through an IV.  Supplemental oxygen. Extra oxygen is given through a tube in the nose, a face mask, or a hood.  Positioning you to lie on your stomach (prone position). This makes it easier for oxygen to get into the lungs.  Continuous positive airway pressure (CPAP) or bi-level positive airway pressure (BPAP) machine. This treatment uses mild air pressure to keep the airways open. A tube that is connected to a motor delivers oxygen to the body.  Ventilator. This treatment moves air into and out of the lungs by using a tube that is placed in your windpipe.  Tracheostomy. This is a procedure to create a hole in the neck so that a breathing tube can be inserted.  Extracorporeal membrane oxygenation (ECMO). This procedure gives the lungs a chance to recover by taking over the functions of the heart and lungs. It supplies oxygen to the body and removes carbon dioxide. Follow these instructions at home: Lifestyle  If you are sick, stay home except to get medical care. Your health care provider will tell you how long to stay home. Call your health care provider before you go for medical care.  Rest at home as told by your health care provider.  Do not use any products that contain nicotine or tobacco, such as cigarettes, e-cigarettes, and  chewing tobacco. If you need help quitting, ask your health care provider.  Return to your normal activities as told by your health care provider. Ask your health care provider what activities are safe for you. General instructions  Take over-the-counter and prescription medicines only as told by your health care provider.  Drink enough fluid to keep your urine pale yellow.  Keep all follow-up visits as told by your health care provider. This is important. How is this prevented?  There is no vaccine to help prevent COVID-19 infection. However, there are steps you can take to protect yourself and others from this virus. To protect yourself:   Do not travel to areas where COVID-19 is a risk. The areas where COVID-19 is reported change often. To identify high-risk areas and travel restrictions, check the CDC travel website: wwwnc.cdc.gov/travel/notices  If you live in, or must travel to, an area where COVID-19 is a risk, take precautions to avoid infection. ? Stay away from people who are sick. ? Wash your hands often with soap and water for 20 seconds. If soap and water   are not available, use an alcohol-based hand sanitizer. ? Avoid touching your mouth, face, eyes, or nose. ? Avoid going out in public, follow guidance from your state and local health authorities. ? If you must go out in public, wear a cloth face covering or face mask. Make sure your mask covers your nose and mouth. ? Avoid crowded indoor spaces. Stay at least 6 feet (2 meters) away from others. ? Disinfect objects and surfaces that are frequently touched every day. This may include:  Counters and tables.  Doorknobs and light switches.  Sinks and faucets.  Electronics, such as phones, remote controls, keyboards, computers, and tablets. To protect others: If you have symptoms of COVID-19, take steps to prevent the virus from spreading to others.  If you think you have a COVID-19 infection, contact your health care  provider right away. Tell your health care team that you think you may have a COVID-19 infection.  Stay home. Leave your house only to seek medical care. Do not use public transport.  Do not travel while you are sick.  Wash your hands often with soap and water for 20 seconds. If soap and water are not available, use alcohol-based hand sanitizer.  Stay away from other members of your household. Let healthy household members care for children and pets, if possible. If you have to care for children or pets, wash your hands often and wear a mask. If possible, stay in your own room, separate from others. Use a different bathroom.  Make sure that all people in your household wash their hands well and often.  Cough or sneeze into a tissue or your sleeve or elbow. Do not cough or sneeze into your hand or into the air.  Wear a cloth face covering or face mask. Make sure your mask covers your nose and mouth. Where to find more information  Centers for Disease Control and Prevention: www.cdc.gov/coronavirus/2019-ncov/index.html  World Health Organization: www.who.int/health-topics/coronavirus Contact a health care provider if:  You live in or have traveled to an area where COVID-19 is a risk and you have symptoms of the infection.  You have had contact with someone who has COVID-19 and you have symptoms of the infection. Get help right away if:  You have trouble breathing.  You have pain or pressure in your chest.  You have confusion.  You have bluish lips and fingernails.  You have difficulty waking from sleep.  You have symptoms that get worse. These symptoms may represent a serious problem that is an emergency. Do not wait to see if the symptoms will go away. Get medical help right away. Call your local emergency services (911 in the [place] Do not drive yourself to the hospital. Let the emergency medical personnel know if you think you have COVID-19. Summary  COVID-19 is a  respiratory infection that is caused by a virus. It is also known as coronavirus disease or novel coronavirus. It can cause serious infections, such as pneumonia, acute respiratory distress syndrome, acute respiratory failure, or sepsis.  The virus that causes COVID-19 is contagious. This means that it can spread from person to person through droplets from breathing, speaking, singing, coughing, or sneezing.  You are more likely to develop a serious illness if you are 50 years of age or older, have a weak immune system, live in a nursing home, or have chronic disease.  There is no medicine to treat COVID-19. Your health care provider will talk with you about ways to treat your symptoms.    Take steps to protect yourself and others from infection. Wash your hands often and disinfect objects and surfaces that are frequently touched every day. Stay away from people who are sick and wear a mask if you are sick. This information is not intended to replace advice given to you by your health care provider. Make sure you discuss any questions you have with your health care provider. Document Revised: 11/26/2018 Document Reviewed: 03/04/2018 Elsevier Patient Education  2020 Elsevier Inc.  

## 2020-02-08 NOTE — Progress Notes (Signed)
Virtual Visit Note  I connected with patient on 02/08/20 at 1012 by telephone due to unable work Epic video visit and verified that I am speaking with the correct person using two identifiers. Cheryl Hansen is currently located at home and no family members are currently with them during visit. The provider, Azalee Course Marwah Disbro, FNP is located in their office at time of visit.  I discussed the limitations, risks, security and privacy concerns of performing an evaluation and management service by telephone and the availability of in person appointments. I also discussed with the patient that there may be a patient responsible charge related to this service. The patient expressed understanding and agreed to proceed.   I provided 20 minutes of non-face-to-face time during this encounter.  Chief Complaint  Patient presents with  . 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 Sister came home for xmas Found out she was positive on Monday Denies any symptoms Sister was having headaches Sunday No vaccinations at this time Works for post [employer]  No Known Allergies  Prior to Admission medications   Medication Sig Start Date End Date Taking? Authorizing Provider  amLODipine (NORVASC) 10 MG tablet Take 1 tablet (10 mg total) by mouth daily. 07/27/19  Yes Janeece Agee, NP  Multiple Vitamin (MULTIVITAMIN) tablet Take 1 tablet by mouth daily.   Yes [provider]    Past Medical History:  Diagnosis Date  . Asthma   . Colitis   . Hypertension     History reviewed. No pertinent surgical history.  Social History   Tobacco Use  . Smoking status: Former Games developer  . Smokeless tobacco: Never Used  Substance Use Topics  . Alcohol use: No    Family History  Problem Relation Age of Onset  . Hypertension Maternal Grandmother   . Hypertension Paternal Grandfather   . Hyperlipidemia Mother    . Cancer Paternal Grandmother 49       colon  . Colitis Paternal Grandmother   . Hypertension Paternal Grandmother   . Hypertension Father     Review of Systems  Constitutional: Negative for chills, fever and malaise/fatigue.  Eyes: Negative for blurred vision and double vision.  Respiratory: Negative for cough, shortness of breath and wheezing.   Cardiovascular: Negative for chest pain, palpitations and leg swelling.  Gastrointestinal: Negative for abdominal pain, blood in stool, constipation, diarrhea, heartburn, nausea and vomiting.  Genitourinary: Negative for dysuria, frequency and hematuria.  Musculoskeletal: Negative for back pain and joint pain.  Skin: Negative for rash.  Neurological: Negative for dizziness, weakness and headaches.    Objective  Constitutional:      General: She is not in acute distress.    Appearance: Normal appearance. She is not ill-appearing.   Pulmonary:     Effort: Pulmonary effort is normal. No respiratory distress.  Neurological:     Mental Status: She is alert and oriented to person, place, and time.  Psychiatric:        Mood and Affect: Mood normal.        Behavior: Behavior normal.     ASSESSMENT and PLAN  Problem List Items Addressed This Visit   None   Visit Diagnoses    Encounter for screening laboratory testing for COVID-19 virus in asymptomatic patient    -  Primary   Relevant Orders   Novel Coronavirus, NAA ([facility])  Vaccine counseling         Will follow up with lab results.  Return if symptoms worsen or fail to improve.    The above assessment and management plan was discussed with the patient. The patient verbalized understanding of and has agreed to the management plan. Patient is aware to call the clinic if symptoms persist or worsen. Patient is aware when to return to the clinic for a follow-up visit. Patient educated on when it is appropriate to go to the emergency department.     Macario Carls Neyland Pettengill,  FNP-BC Primary Care at [facility] [address] [place], [place] [address] Ph.  [phone] Fax [phone]

## 2020-02-08 NOTE — Addendum Note (Signed)
Addended by: Imogene Burn A on: 02/08/2020 01:30 PM   Modules accepted: Orders

## 2020-02-10 LAB — NOVEL CORONAVIRUS, NAA: SARS-CoV-2, NAA: NOT DETECTED

## 2020-02-10 LAB — SARS-COV-2, NAA 2 DAY TAT

## 2020-08-20 ENCOUNTER — Other Ambulatory Visit (HOSPITAL_COMMUNITY)
Admission: RE | Admit: 2020-08-20 | Discharge: 2020-08-20 | Disposition: A | Discharge status: Home / Self Care | Payer: Medicaid Other | Source: Ambulatory Visit | Attending: Obstetrics & Gynecology | Admitting: Obstetrics & Gynecology

## 2020-08-20 ENCOUNTER — Ambulatory Visit (INDEPENDENT_AMBULATORY_CARE_PROVIDER_SITE_OTHER): Payer: BLUE CROSS/BLUE SHIELD | Admitting: Obstetrics & Gynecology

## 2020-08-20 ENCOUNTER — Other Ambulatory Visit: Payer: Self-pay

## 2020-08-20 ENCOUNTER — Encounter: Payer: Self-pay | Admitting: Obstetrics & Gynecology

## 2020-08-20 VITALS — BP 132/76 | HR 78 | Resp 14 | Ht 60.25 in | Wt 136.0 lb

## 2020-08-20 DIAGNOSIS — Z3169 Encounter for other general counseling and advice on procreation: Secondary | ICD-10-CM

## 2020-08-20 DIAGNOSIS — Z113 Encounter for screening for infections with a predominantly sexual mode of transmission: Secondary | ICD-10-CM | POA: Diagnosis not present

## 2020-08-20 DIAGNOSIS — Z01419 Encounter for gynecological examination (general) (routine) without abnormal findings: Secondary | ICD-10-CM | POA: Diagnosis not present

## 2020-08-20 DIAGNOSIS — N979 Female infertility, unspecified: Secondary | ICD-10-CM

## 2020-08-20 NOTE — Progress Notes (Signed)
Cheryl Hansen [birth year] [id number]   History:    30 y.o. G0 New boyfriend x 2 years.   RP:  Established patient presenting for annual gyn exam   HPI: Menstrual periods every month with normal flow.  No BTB.  No pelvic pain.  No pain with IC.  Planning to attempt conception soon.  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  Healthy nutrition. Exercising regularly.  BMI 26.34.  Breasts normal.  BMs and Urine normal.  Health labs with Fam MD.   Past medical history,surgical history, family history and social history were all reviewed and documented in the EPIC chart.  Gynecologic History Patient's last menstrual period was 07/28/2020.  Obstetric History OB History  Gravida Para Term Preterm AB Living  0 0 0 0 0 0  SAB IAB Ectopic Multiple Live Births  0 0 0 0 0     ROS: A ROS was performed and pertinent positives and negatives are included in the history.  GENERAL: No fevers or chills. HEENT: No change in vision, no earache, sore throat or sinus congestion. NECK: No pain or stiffness. CARDIOVASCULAR: No chest pain or pressure. No palpitations. PULMONARY: No shortness of breath, cough or wheeze. GASTROINTESTINAL: No abdominal pain, nausea, vomiting or diarrhea, melena or bright red blood per rectum. GENITOURINARY: No urinary frequency, urgency, hesitancy or dysuria. MUSCULOSKELETAL: No joint or muscle pain, no back pain, no recent trauma. DERMATOLOGIC: No rash, no itching, no lesions. ENDOCRINE: No polyuria, polydipsia, no heat or cold intolerance. No recent change in weight. HEMATOLOGICAL: No anemia or easy bruising or bleeding. NEUROLOGIC: No headache, seizures, numbness, tingling or weakness. PSYCHIATRIC: No depression, no loss of interest in normal activity or change in sleep pattern.     Exam:   BP 132/76 (BP Location: Right Arm, Patient Position: Sitting, Cuff Size: Normal)   Pulse 78   Resp 14   Ht 5' 0.25" (1.53 m)   Wt 136 lb (61.7 kg)   LMP  07/28/2020   BMI 26.34 kg/m   Body mass index is 26.34 kg/m.  General appearance : Well developed well nourished female. No acute distress HEENT: Eyes: no retinal hemorrhage or exudates,  Neck supple, trachea midline, no carotid bruits, no thyroidmegaly Lungs: Clear to auscultation, no rhonchi or wheezes, or rib retractions  Heart: Regular rate and rhythm, no murmurs or gallops Breast:Examined in sitting and supine position were symmetrical in appearance, no palpable masses or tenderness,  no skin retraction, no nipple inversion, no nipple discharge, no skin Discoloration, no axillary or supraclavicular lymphadenopathy Abdomen: no palpable masses or tenderness, no rebound or guarding Extremities: no edema or skin discoloration or tenderness  Pelvic: Vulva: Normal             Vagina: No gross lesions or discharge  Cervix: No gross lesions or discharge.  Pap reflex, Gono-Chlam  Uterus  AV, normal size, shape and consistency, non-tender and mobile  Adnexa  Without masses or tenderness  Anus: Normal   Assessment/Plan:  30 y.o. female for annual exam   1. Encounter for routine gynecological examination with Papanicolaou smear of cervix Normal gynecologic exam.  Pap reflex done.  Breast exam normal.  Good body mass index at 26.34.  Continue with fitness and healthy nutrition.  Health labs with family physician. - Cytology - PAP( [place])  2. Encounter for preconception consultation Patient is fit and has a healthy nutrition.  Good body mass index at 26.34.  Will start on prenatal vitamins when ready  to attempt conception.  Will confirm tubal patency with hysterosalpingography under [facility] after next menstrual period.  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  New partner for 2 years.  Patient has a positive history of chlamydia.  Decision to proceed with a hysterosalpingography under doxycycline.  Procedure explained to patient who voiced  understanding and agreement with plan. - DG Hysterogram (HSG); Future  4. Screen for STD (sexually transmitted disease) - HIV antibody (with reflex) - RPR - Hepatitis B Surface AntiGEN - Hepatitis C Antibody - Cytology - PAP( [place]) with Liston Alba MD, 8:20 AM 08/20/2020

## 2020-08-21 LAB — HEPATITIS C ANTIBODY
Hepatitis C Ab: NONREACTIVE
SIGNAL TO CUT-OFF: 0.02 (ref <1.00)

## 2020-08-21 LAB — HIV ANTIBODY (ROUTINE TESTING W REFLEX): HIV 1&2 Ab, 4th Generation: NONREACTIVE

## 2020-08-21 LAB — HEPATITIS B SURFACE ANTIGEN: Hepatitis B Surface Ag: NONREACTIVE

## 2020-08-21 LAB — RPR: RPR Ser Ql: NONREACTIVE

## 2020-08-28 ENCOUNTER — Telehealth: Payer: Self-pay | Admitting: *Deleted

## 2020-08-28 LAB — CYTOLOGY - PAP
Chlamydia: NEGATIVE
Comment: NEGATIVE
Comment: NEGATIVE
Comment: NORMAL
Diagnosis: UNDETERMINED — AB
High risk HPV: POSITIVE — AB
Neisseria Gonorrhea: NEGATIVE

## 2020-08-28 NOTE — Telephone Encounter (Signed)
-----   Message from [name], MD sent at 08/20/2020  8:41 AM EDT ----- Regarding: Schedule HSG under Doxy H/O Primary Infertility, h/o Chlamydia.  HSG after next period under [facility].

## 2020-08-28 NOTE — Telephone Encounter (Signed)
Late entry) patient was informed to call me back on day 1 of cycle on 08/24/20 (discussed lab results) if cycle starts on weekend she is aware to call on Monday am. The order for HSG will be placed at [facility] [phone] and patient can all to schedule. I will then send in Rx for doxy 100 mg bid x3 days also.

## 2020-08-29 MED ORDER — DOXYCYCLINE HYCLATE 100 MG PO CAPS: ORAL_CAPSULE | ORAL | 0 refills | Status: DC

## 2020-08-29 NOTE — Telephone Encounter (Signed)
Patient sent my chart message LMP:7/0/22.

## 2020-08-30 NOTE — Telephone Encounter (Signed)
Patient scheduled on 09/06/20 for HSG.

## 2020-09-06 ENCOUNTER — Other Ambulatory Visit: Payer: Self-pay

## 2020-09-06 ENCOUNTER — Ambulatory Visit
Admission: RE | Admit: 2020-09-06 | Discharge: 2020-09-06 | Disposition: A | Discharge status: Home / Self Care | Payer: Medicaid Other | Source: Ambulatory Visit | Attending: Obstetrics & Gynecology | Admitting: Obstetrics & Gynecology

## 2020-09-06 DIAGNOSIS — N979 Female infertility, unspecified: Secondary | ICD-10-CM

## 2020-09-07 ENCOUNTER — Ambulatory Visit: Payer: BLUE CROSS/BLUE SHIELD | Admitting: Obstetrics & Gynecology

## 2020-09-07 ENCOUNTER — Ambulatory Visit: Payer: BLUE CROSS/BLUE SHIELD

## 2020-09-24 ENCOUNTER — Other Ambulatory Visit: Payer: Self-pay

## 2020-09-24 ENCOUNTER — Ambulatory Visit (INDEPENDENT_AMBULATORY_CARE_PROVIDER_SITE_OTHER): Payer: BLUE CROSS/BLUE SHIELD | Admitting: Obstetrics & Gynecology

## 2020-09-24 ENCOUNTER — Encounter: Payer: Self-pay | Admitting: Obstetrics & Gynecology

## 2020-09-24 VITALS — BP 122/70 | HR 99 | Wt 139.0 lb

## 2020-09-24 DIAGNOSIS — Z3169 Encounter for other general counseling and advice on procreation: Secondary | ICD-10-CM

## 2020-09-24 DIAGNOSIS — N979 Female infertility, unspecified: Secondary | ICD-10-CM | POA: Diagnosis not present

## 2020-09-24 DIAGNOSIS — N971 Female infertility of tubal origin: Secondary | ICD-10-CM | POA: Diagnosis not present

## 2020-09-24 NOTE — Progress Notes (Signed)
    Cheryl Hansen [birth year] [id number]        30 y.o.  G0 Married  RP: Counseling on HSG/Fertility  HPI: Menstrual periods currently regular every 24-28 days.  Normal flow.  No BTB.  No pelvic pain.  Would like to start attempting conception in about 9 months.  HSG 08/2020.  H/O Chlamydia many years ago.   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   OB History  Gravida Para Term Preterm AB Living  0 0 0 0 0 0  SAB IAB Ectopic Multiple Live Births  0 0 0 0 0    Past medical history,surgical history, problem list, medications, allergies, family history and social history were all reviewed and documented in the EPIC chart.   Directed ROS with pertinent positives and negatives documented in the history of present illness/assessment and plan.  Exam:  Vitals:   09/24/20 0924  BP: 122/70  Pulse: 99  SpO2: 100%  Weight: 139 lb (63 kg)   General appearance:  Normal  HSG 09/06/2020:  1.  Normal uterine cavity.                             2. Intraperitoneal spill of contrast material was identified from the left tube only.                             3. Patency of the right tube to the level of the infundibulum noted without convincing evidence of free intraperitoneal spill.    Assessment/Plan:  30 y.o. G0  1. Obstruction of right distal fallopian tube Hysterosalpingography September 06, 2020 showed a normal uterine cavity and a patent left tube.  The right tube appears to have a distal obstruction.  Patient has a history of chlamydia.  History of previous primary infertility with another partner.  Planning to attempt conception in about 9 months.  Given a probable tubal factor, will attempt conception for 6 months and consult early if becomes pregnant to locate the pregnancy by ultrasound and rule out an ectopic pregnancy.  The risk of infertility as well as the risk of ectopic pregnancy were thoroughly reviewed with patient.  The possible need to perform a  laparoscopy with plasty of the right distal tube discussed with patient.  2. Primary female infertility As above.  3. Encounter for preconception consultation  Recommend a healthy nutrition with plenty of vegetables.  Fitness with aerobic activities 5 times a week and light weightlifting every 2 days.  Will start prenatal vitamins.  [name] MD, 9:44 AM 09/24/2020

## 2020-11-02 ENCOUNTER — Other Ambulatory Visit: Payer: Self-pay | Admitting: Obstetrics & Gynecology

## 2020-11-02 DIAGNOSIS — R87610 Atypical squamous cells of undetermined significance on cytologic smear of cervix (ASC-US): Secondary | ICD-10-CM

## 2020-11-19 ENCOUNTER — Ambulatory Visit: Payer: BLUE CROSS/BLUE SHIELD | Admitting: Obstetrics & Gynecology

## 2020-11-21 ENCOUNTER — Telehealth: Payer: Self-pay

## 2020-11-21 NOTE — Telephone Encounter (Signed)
Patient called because her period started today and she is scheduled for colpo on Friday. I called her back and left message in voice mail that she will need to r/s. Message sent to appt desk to call her and r/s appt.

## 2020-11-22 NOTE — Telephone Encounter (Signed)
Appt r/s's to 01/11/21.

## 2020-11-23 ENCOUNTER — Ambulatory Visit: Payer: BLUE CROSS/BLUE SHIELD | Admitting: Obstetrics & Gynecology

## 2021-01-11 ENCOUNTER — Ambulatory Visit: Payer: BLUE CROSS/BLUE SHIELD | Admitting: Obstetrics & Gynecology

## 2021-01-25 ENCOUNTER — Ambulatory Visit: Payer: BLUE CROSS/BLUE SHIELD | Admitting: Obstetrics & Gynecology

## 2021-01-25 ENCOUNTER — Telehealth: Payer: Self-pay | Admitting: *Deleted

## 2021-01-25 NOTE — Telephone Encounter (Signed)
Call to patient.  Positive Covid test 01/16/21.  Patient reports continued cough and congestion.   LMP 01/05/21. No contraceptive. Sexually active.  Colpo rescheduled to 02/13/21 at 1030.  Instructed patient to abstain from intercourse for 2 weeks prior to colpo. Call prior to appt if menses starts and is heavy.   Patient thankful for call.   Routing to provider for final review. Patient is agreeable to disposition. Will close encounter.

## 2021-01-28 ENCOUNTER — Telehealth: Payer: Self-pay | Admitting: *Deleted

## 2021-01-28 NOTE — Telephone Encounter (Signed)
Patient called reports yesterday noticed blood in urine and lower back pain and pulling sensation in vagina. Has PCOS and last cycle was 2 weeks ago. Reports no burning with urination.  Asked what this could be and what to do. I advised patient to schedule OV with provider. Message sent to appointments.

## 2021-01-30 ENCOUNTER — Encounter: Payer: Self-pay | Admitting: Nurse Practitioner

## 2021-01-30 ENCOUNTER — Other Ambulatory Visit: Payer: Self-pay

## 2021-01-30 ENCOUNTER — Ambulatory Visit (INDEPENDENT_AMBULATORY_CARE_PROVIDER_SITE_OTHER): Payer: BLUE CROSS/BLUE SHIELD | Admitting: Nurse Practitioner

## 2021-01-30 VITALS — BP 118/70

## 2021-01-30 DIAGNOSIS — R310 Gross hematuria: Secondary | ICD-10-CM | POA: Diagnosis not present

## 2021-01-30 DIAGNOSIS — B3731 Acute candidiasis of vulva and vagina: Secondary | ICD-10-CM | POA: Diagnosis not present

## 2021-01-30 DIAGNOSIS — Z87898 Personal history of other specified conditions: Secondary | ICD-10-CM

## 2021-01-30 DIAGNOSIS — N939 Abnormal uterine and vaginal bleeding, unspecified: Secondary | ICD-10-CM

## 2021-01-30 LAB — PREGNANCY, URINE: Preg Test, Ur: NEGATIVE

## 2021-01-30 MED ORDER — FLUCONAZOLE 150 MG PO TABS: 150.0000 mg | ORAL_TABLET | ORAL | 0 refills | Status: DC

## 2021-01-30 NOTE — Progress Notes (Signed)
° °  Acute Office Visit  Subjective:    Patient ID: Cheryl Hansen, female    DOB: [birth year], 30 y.o.   MRN: [id number]   HPI 30 y.o. presents today for left-sided flank pain, 2 episodes of hematuria, and pulling sensation in vagina/pelvis. Symptoms quickly began 3 days ago and then resolved by the end of the day suddenly. No symptoms today. She had a very small amount of pink tissue with wiping that day as well. Sexually active, using withdrawal method only. LMP 01/05/2021. Cycle 1.5 months ago prior to most recent period. History of PCOS. Denies any vaginal symptoms of other urinary symptoms.    Review of Systems  Constitutional: Negative.   Genitourinary:  Positive for flank pain, hematuria (2 episodes three days ago), menstrual problem (PCOS, hx of irregular menses, recent very light spotting) and vaginal pain ("Pulling sensation"). Negative for difficulty urinating, dyspareunia, dysuria, frequency, pelvic pain and vaginal discharge.      Objective:    Physical Exam Constitutional:      Appearance: Normal appearance.  Abdominal:     Tenderness: There is no right CVA tenderness or left CVA tenderness.  Genitourinary:    General: Normal vulva.     Vagina: Normal.     Cervix: Normal.     Uterus: Normal.     BP 118/70    LMP 01/05/2021  Wt Readings from Last 3 Encounters:  09/24/20 139 lb (63 kg)  08/20/20 136 lb (61.7 kg)  08/30/19 127 lb (57.6 kg)   UPT negative UA negative leukocytes, negative nitrites, trace protein, trace blood, SG 1.028, Dark yellow/cloudy. Microscopic: wbc 0-5, rbc 0-2, sq epith 10-20, moderate bacteria, few yeast (hyphae noted)     Assessment & Plan:   Problem List Items Addressed This Visit   None Visit Diagnoses     H/O left flank pain    -  Primary   Relevant Orders   Urinalysis w microscopic + reflex cultur   Gross hematuria       Relevant Orders   Urinalysis w microscopic + reflex cultur   Vaginal spotting       Relevant Orders    Pregnancy, urine   Vaginal candidiasis       Relevant Medications   fluconazole (DIFLUCAN) 150 MG tablet      Plan: UA unremarkable. Symptoms could have been from kidney stone that has passed or ovarian cyst. Encouraged to return if she experiences similar symptoms in the future. Will treat yeast - Diflucan 150 mg today and repeat in 3 days for total of 2 doses. Negative UPT. She will return as needed.      Olivia Mackie DNP, 11:39 AM 01/30/2021

## 2021-02-01 LAB — URINALYSIS W MICROSCOPIC + REFLEX CULTURE
Glucose, UA: NEGATIVE
Hyaline Cast: NONE SEEN /LPF
Leukocyte Esterase: NEGATIVE
Nitrites, Initial: NEGATIVE
Specific Gravity, Urine: 1.028 (ref 1.001–1.035)
pH: 5.5 (ref 5.0–8.0)

## 2021-02-01 LAB — URINE CULTURE
MICRO NUMBER:: 12785517
Result:: NO GROWTH
SPECIMEN QUALITY:: ADEQUATE

## 2021-02-01 LAB — CULTURE INDICATED

## 2021-02-13 ENCOUNTER — Ambulatory Visit: Payer: BLUE CROSS/BLUE SHIELD | Admitting: Obstetrics & Gynecology

## 2021-03-11 ENCOUNTER — Ambulatory Visit: Payer: BLUE CROSS/BLUE SHIELD | Admitting: Obstetrics & Gynecology

## 2021-05-02 ENCOUNTER — Encounter: Payer: Self-pay | Admitting: Obstetrics & Gynecology

## 2021-05-02 ENCOUNTER — Ambulatory Visit (INDEPENDENT_AMBULATORY_CARE_PROVIDER_SITE_OTHER): Payer: BLUE CROSS/BLUE SHIELD | Admitting: Obstetrics & Gynecology

## 2021-05-02 ENCOUNTER — Other Ambulatory Visit: Payer: Self-pay

## 2021-05-02 ENCOUNTER — Other Ambulatory Visit (HOSPITAL_COMMUNITY)
Admission: RE | Admit: 2021-05-02 | Discharge: 2021-05-02 | Disposition: A | Discharge status: Home / Self Care | Payer: Medicaid Other | Source: Ambulatory Visit | Attending: Obstetrics & Gynecology | Admitting: Obstetrics & Gynecology

## 2021-05-02 VITALS — BP 128/80 | HR 78 | Resp 14

## 2021-05-02 DIAGNOSIS — Z01812 Encounter for preprocedural laboratory examination: Secondary | ICD-10-CM | POA: Diagnosis not present

## 2021-05-02 DIAGNOSIS — R87810 Cervical high risk human papillomavirus (HPV) DNA test positive: Secondary | ICD-10-CM

## 2021-05-02 DIAGNOSIS — Z113 Encounter for screening for infections with a predominantly sexual mode of transmission: Secondary | ICD-10-CM | POA: Insufficient documentation

## 2021-05-02 DIAGNOSIS — R87610 Atypical squamous cells of undetermined significance on cytologic smear of cervix (ASC-US): Secondary | ICD-10-CM | POA: Diagnosis present

## 2021-05-02 LAB — PREGNANCY, URINE: Preg Test, Ur: NEGATIVE

## 2021-05-02 NOTE — Progress Notes (Signed)
? ? ?  Cheryl Hansen 05-19-1990 [id number] ? ? ?     31 y.o.  G0P0000 Boyfriend ? ?RP: Pap ASCUS/HPV HR Positive 08/20/2020 for Colposcopy ? ?HPI: Pap ASCUS/HPV HR Positive 08/20/2020.  Previous Paps in 2019 and 2018 were normal.  Would like Gono-Chlam testing today. ? ? ?OB History  ?Gravida Para Term Preterm AB Living  ?0 0 0 0 0 0  ?SAB IAB Ectopic Multiple Live Births  ?0 0 0 0 0  ? ? ?Past medical history,surgical history, problem list, medications, allergies, family history and social history were all reviewed and documented in the EPIC chart. ? ? ?Directed ROS with pertinent positives and negatives documented in the history of present illness/assessment and plan. ? ?Exam: ? ?Vitals:  ? 05/02/21 0955  ?BP: 128/80  ?Pulse: 78  ?Resp: 14  ? ?General appearance:  Normal ? ?UPT Negative ? ?Colposcopy Procedure Note ?Cheryl Hansen ?05/02/2021 ? ?Indications:  ASCUS/HPV HR Positive 08/20/2020 for Colpo ? ?Procedure Details  ?The risks and benefits of the procedure and Written informed consent obtained. ? ?Speculum placed in vagina and excellent visualization of cervix achieved, cervix swabbed x 3 with acetic acid solution. ? ?Findings: ? ?Cervix colposcopy: Physical Exam ?Genitourinary: ? ? ? ?  ? ?Vaginal colposcopy: Normal ? ?Vulvar colposcopy: Normal ? ?Perirectal colposcopy: Normal ? ?The cervix was sprayed with Hurricane before performing the cervical biopsies. ? ?Specimens: Pap/HPV HR, Gono-Chlam.  Cervical Bx at 11 and 3 O'Clock. ? ?Complications: None, good hemostasis with Silver Nitrate ?Marland Kitchen ?Plan: Management per results. ? ? ?Assessment/Plan:  31 y.o. G0 ? ?1. Atypical squamous cells of undetermined significance (ASC-US) on cervical Pap smear ?Pap ASCUS/HPV HR Positive 08/20/2020.  Previous Paps in 2019 and 2018 were normal.  Would like Gono-Chlam testing today.  Counseling on ASCUS/HR HPV Positive. Colposcopy procedure explained.  Colpo findings reviewed with patient.  Post procedure precautions.  Management  per results. ?- Colposcopy ?- Cytology - PAP( [place]) ?- Surgical pathology( [place]/ [facility]) ? ?2. Cervical high risk HPV (human papillomavirus) test positive ?- Cytology - PAP( [place]) ?- Surgical pathology( [place]/ [facility]) ? ?3. Screen for STD (sexually transmitted disease) ?Condom use recommended. ?- Cytology - PAP( [place])- HPV HR, Gono-Chlam ? ?4. Encounter for preprocedural laboratory examination ?UPT Neg ?- Pregnancy, urine  ? ?Cheryl Del MD, 10:05 AM 05/02/2021 ? ? ? ?  ?

## 2021-05-03 LAB — SURGICAL PATHOLOGY

## 2021-05-06 LAB — CYTOLOGY - PAP
Chlamydia: NEGATIVE
Comment: NEGATIVE
Comment: NEGATIVE
Comment: NORMAL
High risk HPV: POSITIVE — AB
Neisseria Gonorrhea: NEGATIVE

## 2021-08-21 ENCOUNTER — Ambulatory Visit (INDEPENDENT_AMBULATORY_CARE_PROVIDER_SITE_OTHER): Payer: BLUE CROSS/BLUE SHIELD | Admitting: Obstetrics & Gynecology

## 2021-08-21 ENCOUNTER — Encounter: Payer: Self-pay | Admitting: Obstetrics & Gynecology

## 2021-08-21 ENCOUNTER — Other Ambulatory Visit (HOSPITAL_COMMUNITY)
Admission: RE | Admit: 2021-08-21 | Discharge: 2021-08-21 | Disposition: A | Discharge status: Home / Self Care | Payer: BLUE CROSS/BLUE SHIELD | Source: Ambulatory Visit | Attending: Obstetrics & Gynecology | Admitting: Obstetrics & Gynecology

## 2021-08-21 VITALS — BP 100/60 | HR 77 | Ht 60.25 in | Wt 136.0 lb

## 2021-08-21 DIAGNOSIS — Z01419 Encounter for gynecological examination (general) (routine) without abnormal findings: Secondary | ICD-10-CM | POA: Insufficient documentation

## 2021-08-21 DIAGNOSIS — N979 Female infertility, unspecified: Secondary | ICD-10-CM | POA: Diagnosis not present

## 2021-08-21 DIAGNOSIS — N870 Mild cervical dysplasia: Secondary | ICD-10-CM

## 2021-08-21 DIAGNOSIS — Z3049 Encounter for surveillance of other contraceptives: Secondary | ICD-10-CM

## 2021-08-21 NOTE — Progress Notes (Signed)
Cheryl Hansen 1990/08/04 [id number]   History:    31 y.o.  G0 New boyfriend x <1 year   RP:  Established patient presenting for annual gyn exam   HPI: Menstrual periods every month, about every 30 days, with normal flow.  No BTB.  No pelvic pain.  No pain with IC.  Planning to attempt conception soon.  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  HSG 08/2020 Left tube patent, Right tube probable distal obstruction.  Healthy nutrition. Exercising regularly.  BMI 26.34.  Breasts normal.  BMs and Urine normal.  Health labs with Fam MD.  Past medical history,surgical history, family history and social history were all reviewed and documented in the EPIC chart.  Gynecologic History Patient's last menstrual period was 08/14/2021 (exact date).  Obstetric History OB History  Gravida Para Term Preterm AB Living  0 0 0 0 0 0  SAB IAB Ectopic Multiple Live Births  0 0 0 0 0     ROS: A ROS was performed and pertinent positives and negatives are included in the history. GENERAL: No fevers or chills. HEENT: No change in vision, no earache, sore throat or sinus congestion. NECK: No pain or stiffness. CARDIOVASCULAR: No chest pain or pressure. No palpitations. PULMONARY: No shortness of breath, cough or wheeze. GASTROINTESTINAL: No abdominal pain, nausea, vomiting or diarrhea, melena or bright red blood per rectum. GENITOURINARY: No urinary frequency, urgency, hesitancy or dysuria. MUSCULOSKELETAL: No joint or muscle pain, no back pain, no recent trauma. DERMATOLOGIC: No rash, no itching, no lesions. ENDOCRINE: No polyuria, polydipsia, no heat or cold intolerance. No recent change in weight. HEMATOLOGICAL: No anemia or easy bruising or bleeding. NEUROLOGIC: No headache, seizures, numbness, tingling or weakness. PSYCHIATRIC: No depression, no loss of interest in normal activity or change in sleep pattern.     Exam:   BP 100/60   Pulse 77   Ht 5' 0.25" (1.53 m)    Wt 136 lb (61.7 kg)   LMP 08/14/2021 (Exact Date) Comment: condoms  SpO2 99%   BMI 26.34 kg/m   Body mass index is 26.34 kg/m.  General appearance : Well developed well nourished female. No acute distress HEENT: Eyes: no retinal hemorrhage or exudates,  Neck supple, trachea midline, no carotid bruits, no thyroidmegaly Lungs: Clear to auscultation, no rhonchi or wheezes, or rib retractions  Heart: Regular rate and rhythm, no murmurs or gallops Breast:Examined in sitting and supine position were symmetrical in appearance, no palpable masses or tenderness,  no skin retraction, no nipple inversion, no nipple discharge, no skin discoloration, no axillary or supraclavicular lymphadenopathy Abdomen: no palpable masses or tenderness, no rebound or guarding Extremities: no edema or skin discoloration or tenderness  Pelvic: Vulva: Normal             Vagina: No gross lesions or discharge  Cervix: No gross lesions or discharge.  Pap/HPV HR done.  Uterus  AV, normal size, shape and consistency, non-tender and mobile  Adnexa  Without masses or tenderness  Anus: Normal   Assessment/Plan:  31 y.o. female for annual exam   1. Encounter for routine gynecological examination with Papanicolaou smear of cervix Menstrual periods every month, about every 30 days, with normal flow.  No BTB.  No pelvic pain.  No pain with IC.  Planning to attempt conception soon.  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  HSG 08/2020 Left tube patent, Right tube probable distal obstruction.  Healthy nutrition. Exercising  regularly.  BMI 26.34.  Breasts normal.  BMs and Urine normal.  Health labs with Fam MD. - Cytology - PAP( [place])  2. Mild dysplasia of cervix (CIN I) - Cytology - PAP( [place])  3. Encounter for surveillance of condom contraception Will continue with condoms until ready to attempt conception.  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  HSG 08/2020 Left tube patent, Right tube probable distal obstruction.  Will discuss with partner.  Will add a Sperm Analysis when ready.  If normal, patient would like to try on Clomid.  If no success at 6 months, will refer to a Fertility specialist.  LPS to attempt reopening the distal Rt tube discussed, but not recommended at this time.  Counseling and management of fertility and h/o Mild cervical Dysplasia, review of documentation, for 15 minutes.  [name] MD, 8:40 AM 08/21/2021

## 2021-08-27 LAB — CYTOLOGY - PAP
Comment: NEGATIVE
High risk HPV: NEGATIVE

## 2021-08-28 ENCOUNTER — Other Ambulatory Visit: Payer: Self-pay

## 2021-08-28 DIAGNOSIS — B379 Candidiasis, unspecified: Secondary | ICD-10-CM

## 2021-08-28 MED ORDER — FLUCONAZOLE 150 MG PO TABS: ORAL_TABLET | ORAL | 0 refills | Status: DC

## 2022-08-26 ENCOUNTER — Ambulatory Visit (INDEPENDENT_AMBULATORY_CARE_PROVIDER_SITE_OTHER): Payer: BLUE CROSS/BLUE SHIELD | Admitting: Obstetrics & Gynecology

## 2022-08-26 ENCOUNTER — Encounter: Payer: Self-pay | Admitting: Obstetrics & Gynecology

## 2022-08-26 ENCOUNTER — Other Ambulatory Visit (HOSPITAL_COMMUNITY)
Admission: RE | Admit: 2022-08-26 | Payer: BLUE CROSS/BLUE SHIELD | Source: Ambulatory Visit | Admitting: Obstetrics & Gynecology

## 2022-08-26 VITALS — BP 120/78 | HR 82 | Ht 59.75 in | Wt 138.0 lb

## 2022-08-26 DIAGNOSIS — N870 Mild cervical dysplasia: Secondary | ICD-10-CM

## 2022-08-26 DIAGNOSIS — Z01419 Encounter for gynecological examination (general) (routine) without abnormal findings: Secondary | ICD-10-CM

## 2022-08-26 DIAGNOSIS — Z3169 Encounter for other general counseling and advice on procreation: Secondary | ICD-10-CM

## 2022-08-26 DIAGNOSIS — N979 Female infertility, unspecified: Secondary | ICD-10-CM | POA: Diagnosis not present

## 2022-08-26 NOTE — Progress Notes (Signed)
Cheryl Hansen [birth year] [id number]   History:    32 y.o. G0 Stable boyfriend    RP:  Established patient presenting for annual gyn exam   HPI: Menstrual periods every month, about every 30 days, with normal flow.  No BTB.  No pelvic pain.  No pain with IC. Colpo 04/2021 Mild Cervical Dysplasia with HPV HR Positive. Pap LGSIL/HPV HR Neg in 08/2021.  Pap/HPV HR today. Planning to attempt conception soon.  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  HSG 08/2020 Left tube patent, Right tube probable distal obstruction. Healthy nutrition. Exercising regularly. BMI 27.18.  Breasts normal.  BMs and Urine normal. Colono Normal 02/2016. Health labs with Fam MD.    Past medical history,surgical history, family history and social history were all reviewed and documented in the EPIC chart.  Gynecologic History Patient's last menstrual period was 07/28/2022 (exact date).  Obstetric History OB History  Gravida Para Term Preterm AB Living  0 0 0 0 0 0  SAB IAB Ectopic Multiple Live Births  0 0 0 0 0     ROS: A ROS was performed and pertinent positives and negatives are included in the history. GENERAL: No fevers or chills. HEENT: No change in vision, no earache, sore throat or sinus congestion. NECK: No pain or stiffness. CARDIOVASCULAR: No chest pain or pressure. No palpitations. PULMONARY: No shortness of breath, cough or wheeze. GASTROINTESTINAL: No abdominal pain, nausea, vomiting or diarrhea, melena or bright red blood per rectum. GENITOURINARY: No urinary frequency, urgency, hesitancy or dysuria. MUSCULOSKELETAL: No joint or muscle pain, no back pain, no recent trauma. DERMATOLOGIC: No rash, no itching, no lesions. ENDOCRINE: No polyuria, polydipsia, no heat or cold intolerance. No recent change in weight. HEMATOLOGICAL: No anemia or easy bruising or bleeding. NEUROLOGIC: No headache, seizures, numbness, tingling or weakness. PSYCHIATRIC: No depression, no loss of  interest in normal activity or change in sleep pattern.     Exam:   BP 120/78   Pulse 82   Ht 4' 11.75" (1.518 m)   Wt 138 lb (62.6 kg)   LMP 07/28/2022 (Exact Date) Comment: sexually active, condoms occ  SpO2 98%   BMI 27.18 kg/m   Body mass index is 27.18 kg/m.  General appearance : Well developed well nourished female. No acute distress HEENT: Eyes: no retinal hemorrhage or exudates,  Neck supple, trachea midline, no carotid bruits, no thyroidmegaly Lungs: Clear to auscultation, no rhonchi or wheezes, or rib retractions  Heart: Regular rate and rhythm, no murmurs or gallops Breast:Examined in sitting and supine position were symmetrical in appearance, no palpable masses or tenderness,  no skin retraction, no nipple inversion, no nipple discharge, no skin discoloration, no axillary or supraclavicular lymphadenopathy Abdomen: no palpable masses or tenderness, no rebound or guarding Extremities: no edema or skin discoloration or tenderness  Pelvic: Vulva: Normal             Vagina: No gross lesions or discharge  Cervix: No gross lesions or discharge.  Pap/HPV HR done.  Uterus  AV, normal size, shape and consistency, non-tender and mobile  Adnexa  Without masses or tenderness  Anus: Normal   Assessment/Plan:  32 y.o. female for annual exam   1. Encounter for routine gynecological examination with Papanicolaou smear of cervix Menstrual periods every month, about every 30 days, with normal flow. No BTB.  No pelvic pain.  No pain with IC. Colpo 04/2021 Mild Cervical Dysplasia with HPV HR Positive. Pap LGSIL/HPV HR Neg in  08/2021.  Pap/HPV HR today. Planning to attempt conception soon.  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  HSG 08/2020 Left tube patent, Right tube probable distal obstruction. Healthy nutrition. Exercising regularly. BMI 27.18.  Breasts normal.  BMs and Urine normal. Colono Normal 02/2016. Health labs with Fam MD.  - Cytology - PAP(  [place])  2. Mild dysplasia of cervix (CIN I) - Cytology - PAP( [place])  3. Encounter for preconception consultation Planning to attempt conception soon.  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  HSG 08/2020 Left tube patent, Right tube probable distal obstruction. Healthy nutrition. Exercising regularly. BMI 27.18. Started on PNVs and Folic Acid.  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  If not successful at conceiving within 6 months, recommend Fertility investigation/treatment.  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   [name] MD, 8:42 AM

## 2022-09-01 LAB — CYTOLOGY - PAP
Comment: NEGATIVE
Diagnosis: NEGATIVE
High risk HPV: NEGATIVE

## 2022-09-28 IMAGING — RF DG HYSTEROGRAM
1 series · 6 of 6 positions shown · IV contrast (omnipaque)
Comparison: None.

CLINICAL DATA: Desires fertility

EXAM:
HYSTEROSALPINGOGRAM
TECHNIQUE: Following cleansing of the cervix and vagina with Betadine solution,
a hysterosalpingogram was performed using a 5-French
hysterosalpingogram catheter and Omnipaque 300 contrast. The patient
tolerated the examination without difficulty.

[Series 1: one shot · 6 of 6 slices shown]
[im 1/6]
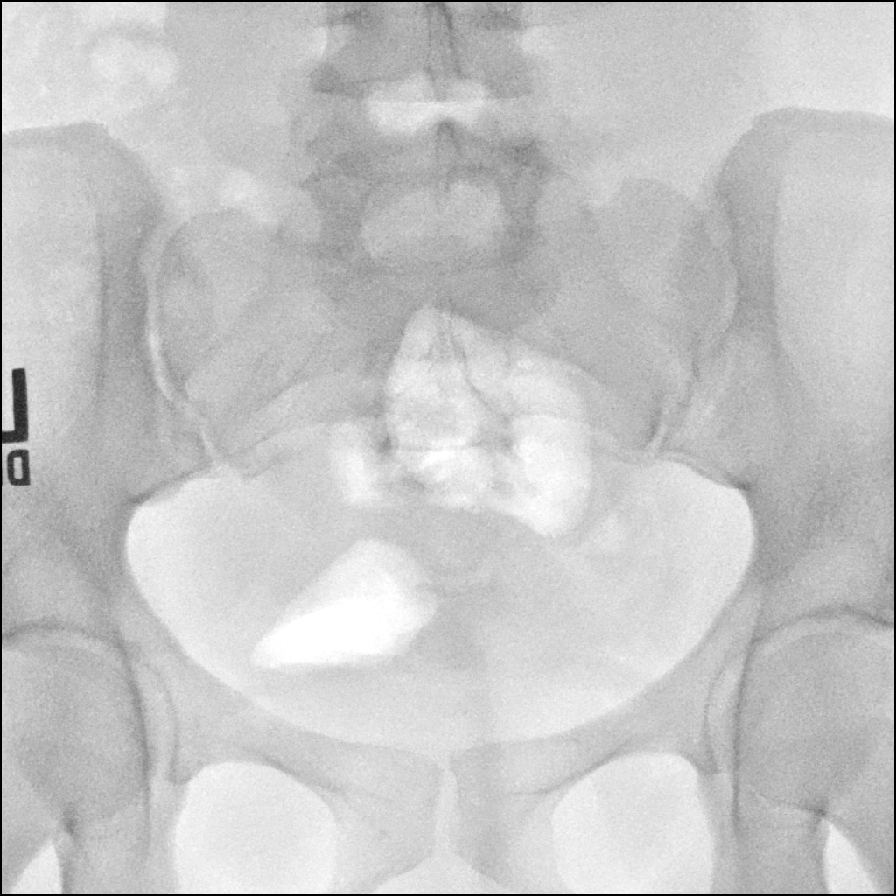
[im 2/6]
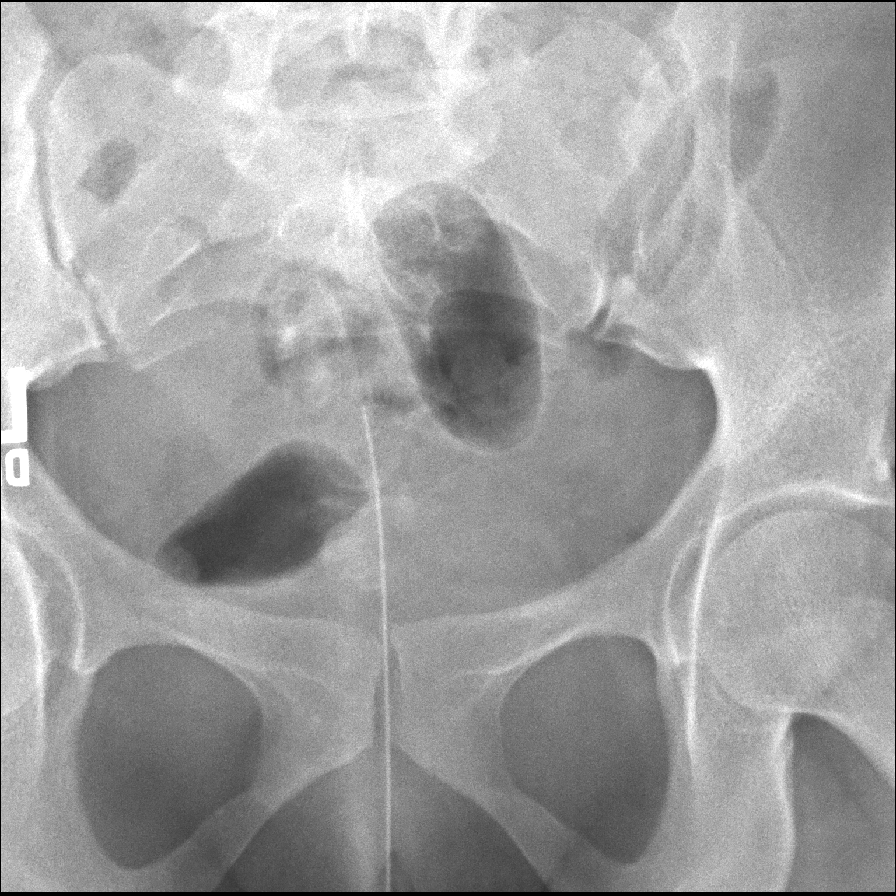
[im 3/6]
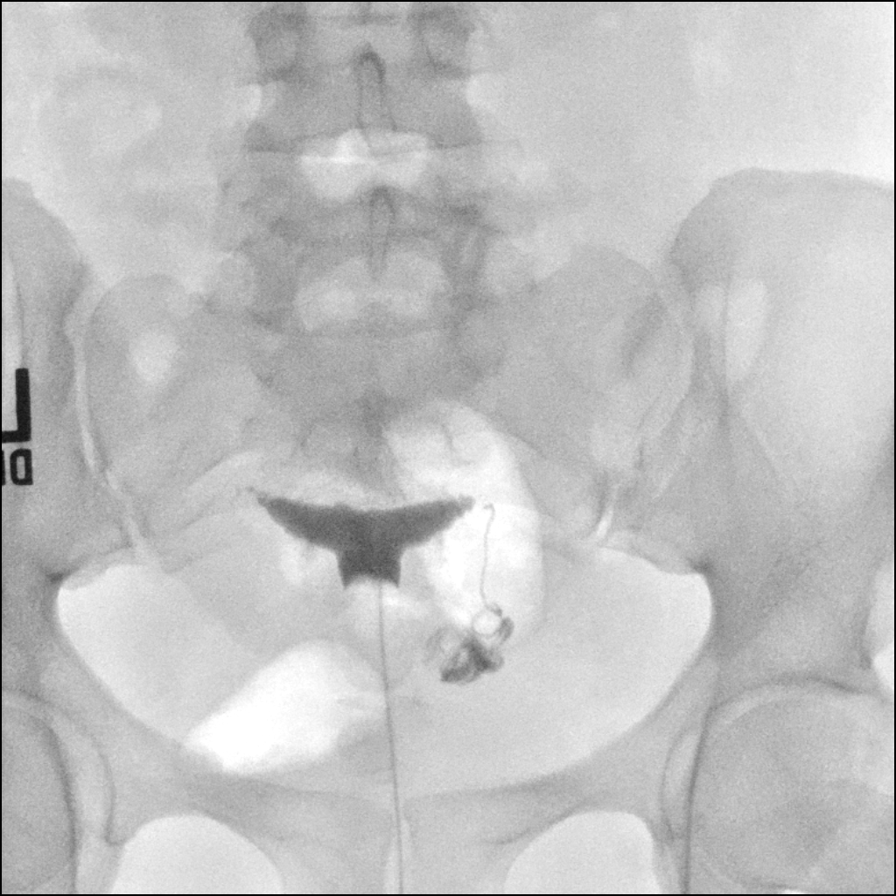
[im 4/6]
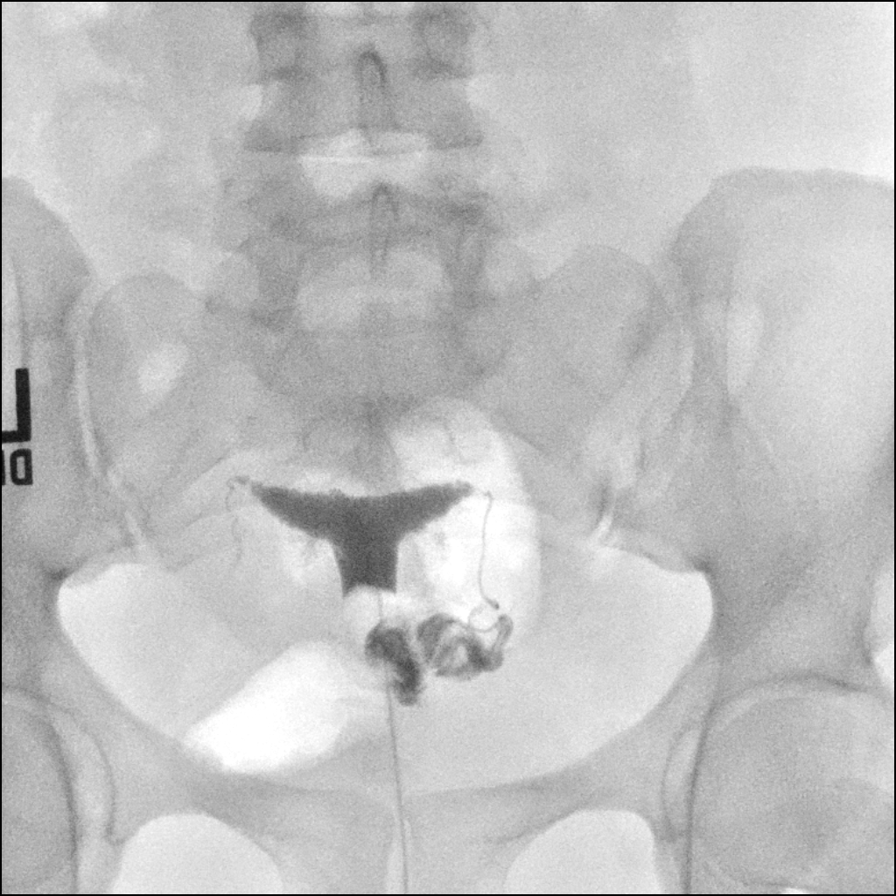
[im 5/6]
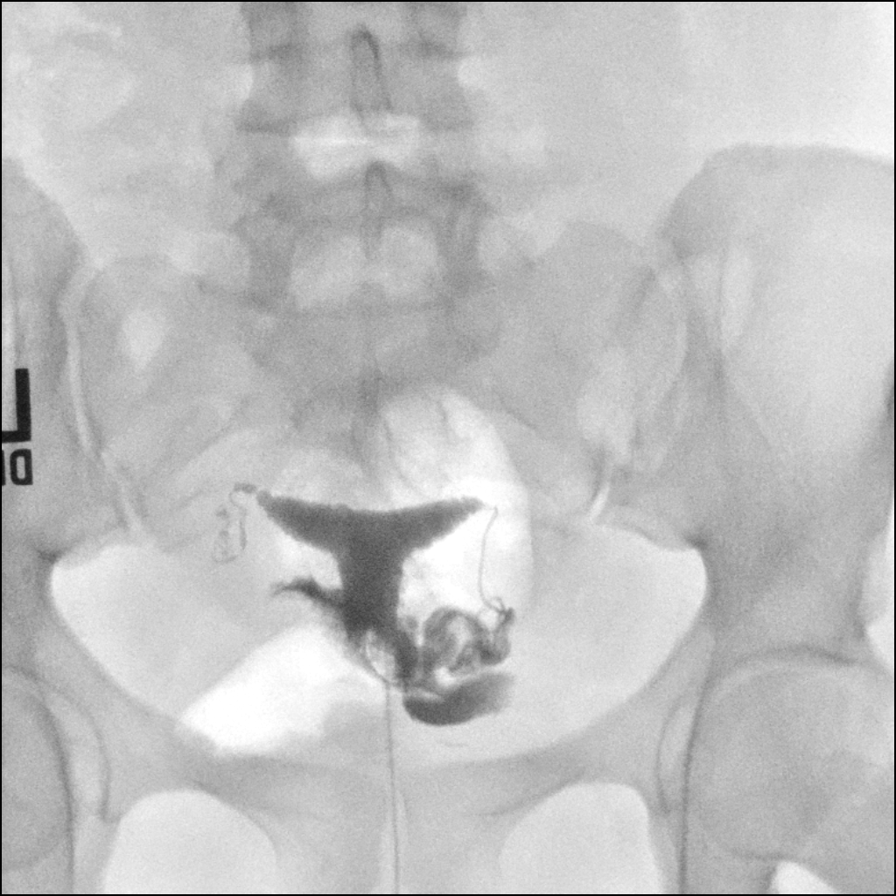
[im 6/6]
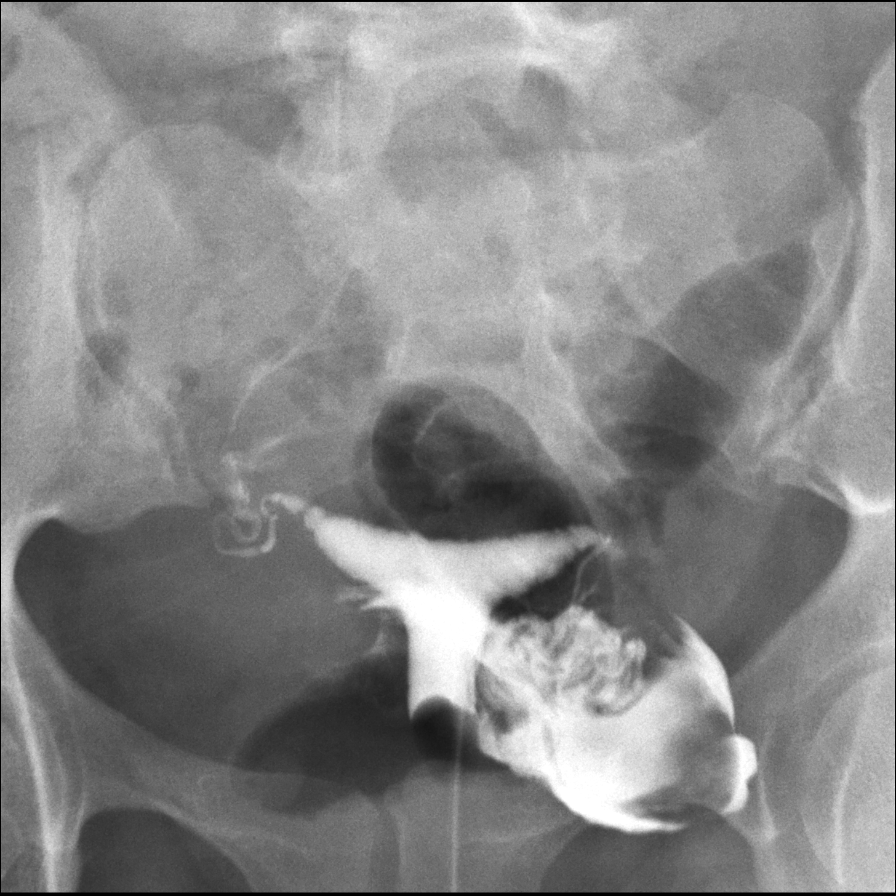

[6 of 6 positions shown; findings below may reference images not displayed]

FLUOROSCOPY TIME:  Radiation Exposure Index (as provided by the
fluoroscopic device): 42 seconds

If the device does not provide the exposure index:

Fluoroscopy Time:  8.2 mGy

Number of Acquired Images:  1
FINDINGS: The endometrial cavity is normal in appearance and contour. No signs
of mullerian duct anomaly.

Opacification of both fallopian tubes is seen. Both tubes appear
normal. Intraperitoneal spill of contrast from the left tube only.
IMPRESSION: 1. Intraperitoneal spill of contrast material was identified from
the left tube only.
2. Patency of the right tube to the level of the infundibulum noted
without convincing evidence of free intraperitoneal spill.

## 2023-08-31 ENCOUNTER — Ambulatory Visit: Payer: BLUE CROSS/BLUE SHIELD | Admitting: Nurse Practitioner

## 2023-12-15 ENCOUNTER — Encounter: Payer: Self-pay | Admitting: Nurse Practitioner

## 2023-12-15 ENCOUNTER — Ambulatory Visit: Admitting: Nurse Practitioner

## 2023-12-15 VITALS — BP 118/78 | HR 89 | Ht 60.0 in | Wt 135.0 lb

## 2023-12-15 DIAGNOSIS — Z3A01 Less than 8 weeks gestation of pregnancy: Secondary | ICD-10-CM

## 2023-12-15 DIAGNOSIS — Z3201 Encounter for pregnancy test, result positive: Secondary | ICD-10-CM

## 2023-12-15 DIAGNOSIS — N911 Secondary amenorrhea: Secondary | ICD-10-CM

## 2023-12-15 DIAGNOSIS — N926 Irregular menstruation, unspecified: Secondary | ICD-10-CM | POA: Insufficient documentation

## 2023-12-15 LAB — PREGNANCY, URINE: Preg Test, Ur: POSITIVE — AB

## 2023-12-15 NOTE — Progress Notes (Signed)
   Acute Office Visit  Subjective:    Patient ID: Cheryl Hansen, female    DOB: [birth year], 33 y.o.   MRN: [id number]   HPI 33 y.o. G0 presents today for pregnancy confirmation. H/O PCOS, infertility (with previous partner). HSG 08/2020 Left tube patent, Right tube probable distal obstruction. LMP in September. H/O HTN, on amlodipine .Taking Inositol. In supportive relationship. Mother present.   Patient's last menstrual period was 10/27/2023 (approximate). Period Cycle (Days): 28 Period Duration (Days): 5 Period Pattern: Regular Menstrual Flow: Moderate Menstrual Control: Maxi pad, Tampon Menstrual Control Change Freq (Hours): 2 Dysmenorrhea: (!) Mild Dysmenorrhea Symptoms: Cramping, Diarrhea  Review of Systems  Constitutional: Negative.   Genitourinary: Negative.        Objective:    Physical Exam Constitutional:      Appearance: Normal appearance.     BP 118/78 (BP Location: Left Arm, Patient Position: Sitting, Cuff Size: Normal)   Pulse 89   Ht 5' (1.524 m)   Wt 135 lb (61.2 kg)   LMP 10/27/2023 (Approximate)   SpO2 99%   BMI 26.37 kg/m  Wt Readings from Last 3 Encounters:  12/15/23 135 lb (61.2 kg)  08/26/22 138 lb (62.6 kg)  08/21/21 136 lb (61.7 kg)        UPT +  Assessment & Plan:   Problem List Items Addressed This Visit       Other   Missed periods   Relevant Orders   Pregnancy, urine   Other Visit Diagnoses       Less than [redacted] weeks gestation of pregnancy    -  Primary      Plan: Positive UPT in office today. Educated on safe pregnancy behaviors, things to avoid, warning signs, will discuss amlodipine  with OB, begin prenatal vitamin and baby aspirin. Will establish with OB in [place] where she lives.   Return if symptoms worsen or fail to improve.    Cheryl DELENA Shutter DNP, 9:41 AM 12/15/2023
